# Patient Record
Sex: Male | Born: 1950
Health system: Southern US, Community
[De-identification: ages and names within clinical notes are randomized; demographics above are authoritative.]

## PROBLEM LIST (undated history)

## (undated) DIAGNOSIS — G629 Polyneuropathy, unspecified: Secondary | ICD-10-CM

## (undated) DIAGNOSIS — K219 Gastro-esophageal reflux disease without esophagitis: Secondary | ICD-10-CM

## (undated) DIAGNOSIS — Z87828 Personal history of other (healed) physical injury and trauma: Secondary | ICD-10-CM

## (undated) HISTORY — DX: Personal history of other (healed) physical injury and trauma: Z87.828

## (undated) HISTORY — PX: ESOPHAGOGASTRODUODENOSCOPY: SHX1529

## (undated) HISTORY — DX: Gilbert syndrome: E80.4

---

## 2002-01-10 DIAGNOSIS — Z87828 Personal history of other (healed) physical injury and trauma: Secondary | ICD-10-CM

## 2002-01-10 HISTORY — DX: Personal history of other (healed) physical injury and trauma: Z87.828

## 2008-06-12 ENCOUNTER — Ambulatory Visit: Payer: Self-pay | Admitting: Gastroenterology

## 2011-05-23 ENCOUNTER — Ambulatory Visit (INDEPENDENT_AMBULATORY_CARE_PROVIDER_SITE_OTHER): Payer: 59 | Admitting: Internal Medicine

## 2011-05-23 ENCOUNTER — Encounter: Payer: Self-pay | Admitting: Internal Medicine

## 2011-05-23 VITALS — BP 128/80 | HR 70 | Temp 98.4°F | Resp 18 | Ht 75.0 in | Wt 200.2 lb

## 2011-05-23 DIAGNOSIS — H539 Unspecified visual disturbance: Secondary | ICD-10-CM

## 2011-05-23 DIAGNOSIS — R5383 Other fatigue: Secondary | ICD-10-CM

## 2011-05-23 DIAGNOSIS — Z13220 Encounter for screening for lipoid disorders: Secondary | ICD-10-CM

## 2011-05-23 DIAGNOSIS — Z87828 Personal history of other (healed) physical injury and trauma: Secondary | ICD-10-CM

## 2011-05-23 DIAGNOSIS — Z125 Encounter for screening for malignant neoplasm of prostate: Secondary | ICD-10-CM

## 2011-05-23 DIAGNOSIS — H409 Unspecified glaucoma: Secondary | ICD-10-CM | POA: Insufficient documentation

## 2011-05-23 DIAGNOSIS — R5381 Other malaise: Secondary | ICD-10-CM

## 2011-05-23 NOTE — Progress Notes (Signed)
Patient ID: Craig Landry, male   DOB: 01-Feb-1950, 61 y.o.   MRN: [id number]  Patient Active Problem List  Diagnoses  . Glaucoma  . History of back injury  . Vision changes  . Screening for lipoid disorders    Subjective:  CC:   Chief Complaint  Patient presents with  . New Patient    HPI:   Craig Landry a 61 y.o. male who presents New patient, for evaluation of vision changes.  Craig Landry was recently diagnosed with glaucoma in both eyes by Lemar Lofty after presenting with 3 discreet episodes of visual changes described as "like looking through a screen door."  First episode lasted 1 hour and resolved spontaneously.  The two subsequent episodes were shorter in duration,  About 15 minutes each, and occurred about 2 weeks apart.  All occurred within the last month and a half. They were unaccompanied by headache or eye pain, and no parasthesias, dysphagia or slurred speech.  His father was apparently misdiagnosed for years and went blind before the diagnosis of temporal arteritis was made by an academic center, and he is worried about that Dr. Oren Bracket missed this diagnosis.  He has started the  Eye drops for glaucoma just a few days ago. ,.    Past Medical History  Diagnosis Date  . Glaucoma   . History of back injury 2004    work related injury ,  fell out of a truck    History reviewed. No pertinent past surgical history.       The following portions of the patient's history were reviewed and updated as appropriate: Allergies, current medications, and problem list.    Review of Systems:   12 Pt  review of systems was negative except those addressed in the HPI,     History   Social History  . Marital Status: Married    Spouse Name: N/A    Number of Children: N/A  . Years of Education: N/A   Occupational History  . SERVICE Chemical engineer,  uses software    Social History Main Topics  . Smoking status: Never Smoker   . Smokeless tobacco: Never  Used  . Alcohol Use: No  . Drug Use: No  . Sexually Active: Not on file   Other Topics Concern  . Not on file   Social History Narrative  . No narrative on file    Objective:  BP 128/80  Pulse 70  Temp(Src) 98.4 F (36.9 C) (Oral)  Resp 18  Ht 6\' 3"  (1.905 m)  Wt 200 lb 4 oz (90.833 kg)  BMI 25.03 kg/m2  SpO2 97%  General appearance: alert, cooperative and appears stated age Ears: normal TM's and external ear canals both ears Throat: lips, mucosa, and tongue normal; teeth and gums normal Neck: no adenopathy, no carotid bruit, supple, symmetrical, trachea midline and thyroid not enlarged, symmetric, no tenderness/mass/nodules Back: symmetric, no curvature. ROM normal. No CVA tenderness. Lungs: clear to auscultation bilaterally Heart: regular rate and rhythm, S1, S2 normal, no murmur, click, rub or gallop Abdomen: soft, non-tender; bowel sounds normal; no masses,  no organomegaly Pulses: 2+ and symmetric Skin: Skin color, texture, turgor normal. No rashes or lesions Lymph nodes: Cervical, supraclavicular, and axillary nodes normal.  Assessment and Plan:  Glaucoma Under treatment by Sydnor,.   Vision changes I see no indication based on patient's exam to suspect TA as the cause of his vision changes.  He has no temple tenderness, no history  of headaches or hypertension .  Reassurance provided.   History of back injury Remote,  Work injury,  With intermittent flares due to overuse.  Not currently symptomatic. Exam normal with regard to strength, sensation and DTRS./  Recommended prn NSAIDs pending evaluation of renal function.   Screening for lipoid disorders Return for fasting labs.     Updated Medication List No outpatient encounter prescriptions on file as of 05/23/2011.     Orders Placed This Encounter  Procedures  . TSH  . Lipid panel  . COMPLETE METABOLIC PANEL WITH GFR  . PSA    No Follow-up on file.

## 2011-05-24 ENCOUNTER — Encounter: Payer: Self-pay | Admitting: Internal Medicine

## 2011-05-24 DIAGNOSIS — H539 Unspecified visual disturbance: Secondary | ICD-10-CM | POA: Insufficient documentation

## 2011-05-24 DIAGNOSIS — Z13220 Encounter for screening for lipoid disorders: Secondary | ICD-10-CM | POA: Insufficient documentation

## 2011-05-24 NOTE — Assessment & Plan Note (Signed)
Return for fasting labs.

## 2011-05-24 NOTE — Assessment & Plan Note (Addendum)
I see no indication based on patient's exam to suspect TA as the cause of his vision changes.  He has no temple tenderness, no history of headaches or hypertension .  Reassurance provided.

## 2011-05-24 NOTE — Assessment & Plan Note (Signed)
Remote,  Work injury,  With intermittent flares due to overuse.  Not currently symptomatic. Exam normal with regard to strength, sensation and DTRS./  Recommended prn NSAIDs pending evaluation of renal function.

## 2011-05-24 NOTE — Assessment & Plan Note (Signed)
Under treatment by Oren Bracket,.

## 2011-07-05 ENCOUNTER — Other Ambulatory Visit: Payer: 59

## 2011-07-12 ENCOUNTER — Encounter (INDEPENDENT_AMBULATORY_CARE_PROVIDER_SITE_OTHER): Payer: 59 | Admitting: Internal Medicine

## 2012-01-20 ENCOUNTER — Encounter: Payer: Self-pay | Admitting: Internal Medicine

## 2012-01-20 ENCOUNTER — Ambulatory Visit (INDEPENDENT_AMBULATORY_CARE_PROVIDER_SITE_OTHER): Payer: 59 | Admitting: Internal Medicine

## 2012-01-20 VITALS — BP 126/86 | HR 76 | Temp 97.5°F | Resp 16 | Ht 74.0 in | Wt 199.5 lb

## 2012-01-20 DIAGNOSIS — Z125 Encounter for screening for malignant neoplasm of prostate: Secondary | ICD-10-CM

## 2012-01-20 DIAGNOSIS — R5381 Other malaise: Secondary | ICD-10-CM

## 2012-01-20 DIAGNOSIS — Z1211 Encounter for screening for malignant neoplasm of colon: Secondary | ICD-10-CM

## 2012-01-20 DIAGNOSIS — Z13220 Encounter for screening for lipoid disorders: Secondary | ICD-10-CM

## 2012-01-20 DIAGNOSIS — Z0000 Encounter for general adult medical examination without abnormal findings: Secondary | ICD-10-CM

## 2012-01-20 DIAGNOSIS — R5383 Other fatigue: Secondary | ICD-10-CM

## 2012-01-20 LAB — POCT URINALYSIS DIPSTICK
Ketones, UA: NEGATIVE
Leukocytes, UA: NEGATIVE
Nitrite, UA: NEGATIVE
Protein, UA: NEGATIVE

## 2012-01-20 LAB — TSH: TSH: 0.87 u[IU]/mL (ref 0.35–5.50)

## 2012-01-20 LAB — LIPID PANEL: HDL: 38.3 mg/dL — ABNORMAL LOW (ref >39.00)

## 2012-01-20 NOTE — Progress Notes (Signed)
Patient ID: Craig Landry, male   DOB: 07/16/1950, 62 y.o.   MRN: [id number]  Patient Active Problem List  Diagnosis  . Glaucoma  . History of back injury  . Vision changes  . Screening for lipoid disorders  . Routine general medical examination at a health care facility    Subjective:  CC:   Chief Complaint  Patient presents with  . Annual Exam    HPI:   Craig Landry a 62 y.o. 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 At last visit he had a recent opthalmologic precedure and was worried about his vision.  Vision is about the same as last time,  glaucoma managed by Syndnor, last eye pressure was 17 per patient. Has not had a screening coloosocopy ever,  But had an EGD  Years ago for reflux.    Past Medical History  Diagnosis Date  . History of back injury 2004    work related injury ,  fell out of a truck  . Glaucoma(365)     History reviewed. No pertinent past surgical history.       The following portions of the patient's history were reviewed and updated as appropriate: Allergies, current medications, and problem list.    Review of Systems:  Patient denies headache, fevers, malaise, unintentional weight loss, skin rash, eye pain, sinus congestion and sinus pain, sore throat, dysphagia,  hemoptysis , cough, dyspnea, wheezing, chest pain, palpitations, orthopnea, edema, abdominal pain, nausea, melena, diarrhea, constipation, flank pain, dysuria, hematuria, urinary  Frequency, nocturia, numbness, tingling, seizures,  Focal weakness, Loss of consciousness,  Tremor, insomnia, depression, anxiety, and suicidal ideation.       History   Social History  . Marital Status: Married    Spouse Name: N/A    Number of Children: N/A  . Years of Education: N/A   Occupational History  . SERVICE Chemical engineer,  uses software    Social History Main Topics  . Smoking status: Never Smoker   . Smokeless  tobacco: Never Used  . Alcohol Use: No  . Drug Use: No  . Sexually Active: Not on file   Other Topics Concern  . Not on file   Social History Narrative  . No narrative on file    Objective:  BP 126/86  Pulse 76  Temp 97.5 F (36.4 C) (Oral)  Resp 16  Ht 6\' 2"  (1.88 m)  Wt 199 lb 8 oz (90.493 kg)  BMI 25.61 kg/m2  SpO2 96%  General Appearance:    Alert, cooperative, no distress, appears stated age  Head:    Normocephalic, without obvious abnormality, atraumatic  Eyes:    PERRL, conjunctiva/corneas clear, EOM's intact, fundi    benign, both eyes       Ears:    Normal TM's and external ear canals, both ears  Nose:   Nares normal, septum midline, mucosa normal, no drainage   or sinus tenderness  Throat:   Lips, mucosa, and tongue normal; teeth and gums normal  Neck:   Supple, symmetrical, trachea midline, no adenopathy;       thyroid:  No enlargement/tenderness/nodules; no carotid   bruit or JVD  Back:     Symmetric, no curvature, ROM normal, no CVA tenderness  Lungs:     Clear to auscultation bilaterally, respirations unlabored  Chest wall:    No tenderness or deformity  Heart:    Regular rate and rhythm, S1  and S2 normal, no murmur, rub   or gallop  Abdomen:     Soft, non-tender, bowel sounds active all four quadrants,    no masses, no organomegaly  Genitalia:    Normal male without lesion, discharge or tenderness  Rectal:    Normal tone, normal prostate, no masses or tenderness;     Extremities:   Extremities normal, atraumatic, no cyanosis or edema  Pulses:   2+ and symmetric all extremities  Skin:   Skin color, texture, turgor normal, no rashes or lesions  Lymph nodes:   Cervical, supraclavicular, and axillary nodes normal  Neurologic:   CNII-XII intact. Normal strength, sensation and reflexes      throughout   Assessment and Plan:  Routine general medical examination at a health care facility His exam today was normal including genital and prostate exams.  Screening labs have been ordered PSA is normal.   Updated Medication List No outpatient encounter prescriptions on file as of 01/20/2012.     Orders Placed This Encounter  Procedures  . POCT Urinalysis Dipstick    No Follow-up on file.

## 2012-01-21 LAB — COMPLETE METABOLIC PANEL WITH GFR
Albumin: 4.3 g/dL (ref 3.5–5.2)
CO2: 31 mEq/L (ref 19–32)
Calcium: 9.6 mg/dL (ref 8.4–10.5)
Chloride: 102 mEq/L (ref 96–112)
GFR, Est African American: >89 mL/min
GFR, Est Non African American: 88 mL/min
Glucose, Bld: 115 mg/dL — ABNORMAL HIGH (ref 70–99)
Potassium: 4.4 mEq/L (ref 3.5–5.3)
Sodium: 141 mEq/L (ref 135–145)
Total Protein: 6.5 g/dL (ref 6.0–8.3)

## 2012-01-22 ENCOUNTER — Encounter: Payer: Self-pay | Admitting: Internal Medicine

## 2012-01-22 DIAGNOSIS — Z1211 Encounter for screening for malignant neoplasm of colon: Secondary | ICD-10-CM | POA: Insufficient documentation

## 2012-01-22 DIAGNOSIS — Z0000 Encounter for general adult medical examination without abnormal findings: Secondary | ICD-10-CM | POA: Insufficient documentation

## 2012-01-22 NOTE — Assessment & Plan Note (Signed)
His exam today was normal including genital and prostate exams. Screening labs have been ordered PSA is normal.

## 2012-01-24 DIAGNOSIS — Z0279 Encounter for issue of other medical certificate: Secondary | ICD-10-CM

## 2013-05-29 ENCOUNTER — Telehealth: Payer: Self-pay | Admitting: Internal Medicine

## 2013-05-29 NOTE — Telephone Encounter (Signed)
Has appt scheduled for tomorrow.

## 2013-05-29 NOTE — Telephone Encounter (Signed)
Patient Information:  Caller Name: Sylas  Phone: [phone]  Patient: Craig Landry, Craig Landry  Gender: Male  DOB: Mar 25, 1950  Age: 63 Years  PCP: Deborra Medina (Adults only)  Office Follow Up:  Does the office need to follow up with this patient?: No  Instructions For The Office: N/A  RN Note:  Has an appointment scheduled on 05/30/13 at 13:15. Advised patient to keep currently scheduled appointment, but to call office if any new symptoms or worsening symptoms develop. Patient verbalized understanding.  Symptoms  Reason For Call & Symptoms: Tick bite. Boil is now developing over where the tick bite was. Overnight the swelling and inflammation started. Patient reports he has been "digging at it." Reports the bite was on the waist area beneath his waistband.  Reviewed Health History In EMR: Yes  Reviewed Medications In EMR: Yes  Reviewed Allergies In EMR: Yes  Reviewed Surgeries / Procedures: Yes  Date of Onset of Symptoms: 05/28/2013  Treatments Tried: Removed tick and head was intact.  Treatments Tried Worked: No  Guideline(s) Used:  Tick Bite  Disposition Per Guideline:   See within 24 hours  Reason For Disposition Reached:   Patient wants to be seen  Advice Given:  N/A  Patient Will Follow Care Advice:  YES

## 2013-05-30 ENCOUNTER — Encounter: Payer: Self-pay | Admitting: Internal Medicine

## 2013-05-30 ENCOUNTER — Ambulatory Visit (INDEPENDENT_AMBULATORY_CARE_PROVIDER_SITE_OTHER): Payer: 59 | Admitting: Internal Medicine

## 2013-05-30 ENCOUNTER — Encounter (INDEPENDENT_AMBULATORY_CARE_PROVIDER_SITE_OTHER): Payer: Self-pay

## 2013-05-30 VITALS — BP 130/82 | HR 72 | Temp 98.2°F | Resp 16 | Ht 74.0 in | Wt 201.8 lb

## 2013-05-30 DIAGNOSIS — S80862A Insect bite (nonvenomous), left lower leg, initial encounter: Secondary | ICD-10-CM | POA: Insufficient documentation

## 2013-05-30 DIAGNOSIS — S30861A Insect bite (nonvenomous) of abdominal wall, initial encounter: Secondary | ICD-10-CM

## 2013-05-30 DIAGNOSIS — W57XXXA Bitten or stung by nonvenomous insect and other nonvenomous arthropods, initial encounter: Principal | ICD-10-CM | POA: Insufficient documentation

## 2013-05-30 DIAGNOSIS — S30860A Insect bite (nonvenomous) of lower back and pelvis, initial encounter: Secondary | ICD-10-CM

## 2013-05-30 MED ORDER — GENTAMICIN SULFATE 0.1 % EX OINT: 1.0000 "application " | TOPICAL_OINTMENT | Freq: Two times a day (BID) | CUTANEOUS | Status: DC

## 2013-05-30 MED ORDER — DOXYCYCLINE HYCLATE 100 MG PO TABS: 100.0000 mg | ORAL_TABLET | Freq: Two times a day (BID) | ORAL | Status: DC

## 2013-05-30 NOTE — Assessment & Plan Note (Signed)
Small local reaction at site of tick bite. Will start empiric topical gentamicin to help prevent secondary bacterial infection. Discussed signs/symptoms of RMSF. He was given Rx for Doxycycline to start if any fever, chills, headache, malaise. He will call or RTC if any of these symptoms develop.

## 2013-05-30 NOTE — Patient Instructions (Addendum)
Call immediately if any fever or headache this week.   Start topical gentamicin twice daily. Start Doxycycline twice daily. Avoid sun exposure while on Doxycycline.  Follow up recheck next week if area is not improved.   Consider seeing Dr. Malvin Johns regarding your hand.

## 2013-05-30 NOTE — Progress Notes (Signed)
   Subjective:    Patient ID: Craig Landry, male    DOB: 1950-05-14, 63 y.o.   MRN: [id number]  HPI 63YO male presents for acute visit. Noticed tick on his abdomen yesterday. Pulled tick off, however feels that head of tick may be in place. Notes some redness at site. Not painful, but occasionally itching. No fever, headache. No other symptoms.   Review of Systems  Constitutional: Negative for fever, chills and fatigue.  Respiratory: Negative for shortness of breath.   Cardiovascular: Negative for chest pain.  Musculoskeletal: Negative for arthralgias and myalgias.  Skin: Positive for color change, rash and wound.  Neurological: Positive for headaches. Negative for weakness.       Objective:    BP 130/82  Pulse 72  Temp(Src) 98.2 F (36.8 C) (Oral)  Resp 16  Ht 6\' 2"  (1.88 m)  Wt 201 lb 12 oz (91.513 kg)  BMI 25.89 kg/m2  SpO2 98% Physical Exam  Constitutional: He appears well-developed and well-nourished. No distress.  HENT:  Head: Normocephalic and atraumatic.  Eyes: Pupils are equal, round, and reactive to light.  Neck: Normal range of motion.  Pulmonary/Chest: Effort normal.  Skin: Skin is warm. He is not diaphoretic. There is erythema.             Assessment & Plan:   Problem List Items Addressed This Visit     Unprioritized   Tick bite of abdomen - Primary     Small local reaction at site of tick bite. Will start empiric topical gentamicin to help prevent secondary bacterial infection. Discussed signs/symptoms of RMSF. He was given Rx for Doxycycline to start if any fever, chills, headache, malaise. He will call or RTC if any of these symptoms develop.    Relevant Medications      DOXYCYCLINE HYCLATE 100 MG PO TABS      gentamicin (GARAMYCIN) ointment 0.1%       Return in about 1 week (around 06/06/2013) for Recheck.

## 2013-05-30 NOTE — Progress Notes (Signed)
Pre visit review using our clinic review tool, if applicable. No additional management support is needed unless otherwise documented below in the visit note. 

## 2013-06-07 ENCOUNTER — Ambulatory Visit: Payer: 59 | Admitting: Internal Medicine

## 2013-06-20 ENCOUNTER — Encounter: Payer: Self-pay | Admitting: Internal Medicine

## 2013-06-20 ENCOUNTER — Ambulatory Visit (INDEPENDENT_AMBULATORY_CARE_PROVIDER_SITE_OTHER): Payer: 59 | Admitting: Internal Medicine

## 2013-06-20 VITALS — BP 138/90 | HR 67 | Temp 97.9°F | Resp 16 | Ht 75.0 in | Wt 197.5 lb

## 2013-06-20 DIAGNOSIS — S30860A Insect bite (nonvenomous) of lower back and pelvis, initial encounter: Secondary | ICD-10-CM

## 2013-06-20 DIAGNOSIS — IMO0001 Reserved for inherently not codable concepts without codable children: Secondary | ICD-10-CM

## 2013-06-20 DIAGNOSIS — E785 Hyperlipidemia, unspecified: Secondary | ICD-10-CM

## 2013-06-20 DIAGNOSIS — S30861A Insect bite (nonvenomous) of abdominal wall, initial encounter: Secondary | ICD-10-CM

## 2013-06-20 DIAGNOSIS — R5381 Other malaise: Secondary | ICD-10-CM

## 2013-06-20 DIAGNOSIS — M549 Dorsalgia, unspecified: Secondary | ICD-10-CM

## 2013-06-20 DIAGNOSIS — Z125 Encounter for screening for malignant neoplasm of prostate: Secondary | ICD-10-CM

## 2013-06-20 DIAGNOSIS — R030 Elevated blood-pressure reading, without diagnosis of hypertension: Secondary | ICD-10-CM

## 2013-06-20 DIAGNOSIS — W57XXXA Bitten or stung by nonvenomous insect and other nonvenomous arthropods, initial encounter: Secondary | ICD-10-CM

## 2013-06-20 DIAGNOSIS — R5383 Other fatigue: Secondary | ICD-10-CM

## 2013-06-20 LAB — POCT URINALYSIS DIPSTICK
BILIRUBIN UA: NEGATIVE
Glucose, UA: NEGATIVE
KETONES UA: NEGATIVE
Leukocytes, UA: NEGATIVE
Nitrite, UA: NEGATIVE
Protein, UA: NEGATIVE
RBC UA: NEGATIVE
Spec Grav, UA: 1.015
Urobilinogen, UA: 0.2
pH, UA: 6.5

## 2013-06-20 NOTE — Patient Instructions (Signed)
Your tick bite is resolving ,  But you should finish the doxycycline twice daily  With big meals to prevent stomach upset.  This protects you agains t [facility] Spotted Fever and other tick borne illnesses  Your urine was negative for signs of infection  Your Back pain is due to strain ,  Try using ibuprofen  Up to 800 mg every 8 hours, along with tylenol (acetominophen) 1000 mg every 8 hours  You can return for tick bite serologies (blood tests ) mid July  Along with fasting labs .  Please schedule your annual physical in late July or early [place]

## 2013-06-20 NOTE — Progress Notes (Signed)
Patient ID: Craig Landry, male   DOB: 09-12-1950, 63 y.o.   MRN: [id number]   Patient Active Problem List   Diagnosis Date Noted  . Tick bite of abdomen 05/30/2013  . Routine general medical examination at a health care facility 01/22/2012  . Screening for colon cancer 01/22/2012  . Vision changes 05/24/2011  . Screening for lipoid disorders 05/24/2011  . Glaucoma   . History of back injury     Subjective:  CC:   Chief Complaint  Patient presents with  . Tick Removal    follow up,  saw Dr. walker was prescribed doxy took app. half an stopped.  . Back Pain    bilateral at the end of costal cartilage 11 and 12 rib.    HPI:   Craig Landry is a 63 y.o. male who presents for follow up on tick bite. Removed engorged tick from his lower abdomen the week before [facility],  Was treated with topical gentamycin  by JW for skin irritation /localized reaction thought to be related to the bite.  Developed symptoms   Of redness resolved..  Last week started feeling poorly,  No fevers,  (he checked) area became irritated again so he started takign the doxycycline   Past Medical History  Diagnosis Date  . History of back injury 2004    work related injury ,  fell out of a truck  . Glaucoma     No past surgical history on file.     The following portions of the patient's history were reviewed and updated as appropriate: Allergies, current medications, and problem list.    Review of Systems:   Patient denies headache, fevers, malaise, unintentional weight loss, skin rash, eye pain, sinus congestion and sinus pain, sore throat, dysphagia,  hemoptysis , cough, dyspnea, wheezing, chest pain, palpitations, orthopnea, edema, abdominal pain, nausea, melena, diarrhea, constipation, flank pain, dysuria, hematuria, urinary  Frequency, nocturia, numbness, tingling, seizures,  Focal weakness, Loss of consciousness,  Tremor, insomnia, depression, anxiety, and suicidal ideation.      History   Social History  . Marital Status: Married    Spouse Name: N/A    Number of Children: N/A  . Years of Education: N/A   Occupational History  . SERVICE Tour manager,  uses software    Social History Main Topics  . Smoking status: Never Smoker   . Smokeless tobacco: Never Used  . Alcohol Use: No  . Drug Use: No  . Sexual Activity: Not on file   Other Topics Concern  . Not on file   Social History Narrative  . No narrative on file    Objective:  Filed Vitals:   06/20/13 1020  BP: 138/90  Pulse: 67  Temp: 97.9 F (36.6 C)  Resp: 16     General appearance: alert, cooperative and appears stated age Ears: normal TM's and external ear canals both ears Throat: lips, mucosa, and tongue normal; teeth and gums normal Neck: no adenopathy, no carotid bruit, supple, symmetrical, trachea midline and thyroid not enlarged, symmetric, no tenderness/mass/nodules Back: symmetric, no curvature. ROM normal. No CVA tenderness. Lungs: clear to auscultation bilaterally Heart: regular rate and rhythm, S1, S2 normal, no murmur, click, rub or gallop Abdomen: soft, non-tender; bowel sounds normal; no masses,  no organomegaly Pulses: 2+ and symmetric Skin: Skin color, texture, turgor normal. Lower abdomen with localized erythema. No rashes or lesions Lymph nodes: Cervical, supraclavicular, and axillary nodes normal.  Assessment and Plan:  Tick  bite of abdomen Localized erythema appears to be irritation either from ue of topical abx or sequelae of healing process.  Stop gentamycin   Use  Doxycycline  to complete abx regimen.    Updated Medication List Outpatient Encounter Prescriptions as of 06/20/2013  Medication Sig  . cholecalciferol (VITAMIN D) 1000 UNITS tablet Take 1,000 Units by mouth daily.  Marland Kitchen doxycycline (VIBRA-TABS) 100 MG tablet Take 1 tablet (100 mg total) by mouth 2 (two) times daily.  Marland Kitchen gentamicin ointment (GARAMYCIN) 0.1 % Apply 1 application topically  2 (two) times daily.     Orders Placed This Encounter  Procedures  . CBC with Differential  . Comprehensive metabolic panel  . TSH  . Lipid panel  . PSA  . Microalbumin / creatinine urine ratio  . Rocky [place]tn spotted fvr abs pnl(IgG+IgM)  . Ehrlichia antibody panel  . POCT Urinalysis Dipstick    Return in about 3 months (around 09/20/2013).

## 2013-06-23 NOTE — Assessment & Plan Note (Signed)
Localized erythema appears to be irritation either from ue of topical abx or sequelae of healing process.  Stop gentamycin   Use  Doxycycline  to complete abx regimen.

## 2013-07-26 ENCOUNTER — Other Ambulatory Visit (INDEPENDENT_AMBULATORY_CARE_PROVIDER_SITE_OTHER): Payer: 59

## 2013-07-26 DIAGNOSIS — E785 Hyperlipidemia, unspecified: Secondary | ICD-10-CM

## 2013-07-26 DIAGNOSIS — R5383 Other fatigue: Principal | ICD-10-CM

## 2013-07-26 DIAGNOSIS — IMO0001 Reserved for inherently not codable concepts without codable children: Secondary | ICD-10-CM

## 2013-07-26 DIAGNOSIS — R030 Elevated blood-pressure reading, without diagnosis of hypertension: Secondary | ICD-10-CM

## 2013-07-26 DIAGNOSIS — S30861A Insect bite (nonvenomous) of abdominal wall, initial encounter: Secondary | ICD-10-CM

## 2013-07-26 DIAGNOSIS — Z125 Encounter for screening for malignant neoplasm of prostate: Secondary | ICD-10-CM

## 2013-07-26 DIAGNOSIS — R5381 Other malaise: Secondary | ICD-10-CM

## 2013-07-26 DIAGNOSIS — W57XXXA Bitten or stung by nonvenomous insect and other nonvenomous arthropods, initial encounter: Secondary | ICD-10-CM

## 2013-07-26 LAB — LIPID PANEL
CHOLESTEROL: 154 mg/dL (ref 0–200)
HDL: 36.4 mg/dL — ABNORMAL LOW (ref >39.00)
LDL Cholesterol: 87 mg/dL (ref 0–99)
NONHDL: 117.6
Total CHOL/HDL Ratio: 4
Triglycerides: 154 mg/dL — ABNORMAL HIGH (ref 0.0–149.0)
VLDL: 30.8 mg/dL (ref 0.0–40.0)

## 2013-07-26 LAB — CBC WITH DIFFERENTIAL/PLATELET
BASOS PCT: 0.5 % (ref 0.0–3.0)
Basophils Absolute: 0 10*3/uL (ref 0.0–0.1)
Eosinophils Absolute: 0.2 10*3/uL (ref 0.0–0.7)
Eosinophils Relative: 4.6 % (ref 0.0–5.0)
HEMATOCRIT: 42.5 % (ref 39.0–52.0)
Hemoglobin: 14.4 g/dL (ref 13.0–17.0)
LYMPHS ABS: 1.5 10*3/uL (ref 0.7–4.0)
Lymphocytes Relative: 29.2 % (ref 12.0–46.0)
MCHC: 33.8 g/dL (ref 30.0–36.0)
MCV: 93.9 fl (ref 78.0–100.0)
MONO ABS: 0.5 10*3/uL (ref 0.1–1.0)
MONOS PCT: 9.9 % (ref 3.0–12.0)
Neutro Abs: 2.8 10*3/uL (ref 1.4–7.7)
Neutrophils Relative %: 55.8 % (ref 43.0–77.0)
Platelets: 191 10*3/uL (ref 150.0–400.0)
RBC: 4.52 Mil/uL (ref 4.22–5.81)
RDW: 13.2 % (ref 11.5–15.5)
WBC: 5.1 10*3/uL (ref 4.0–10.5)

## 2013-07-26 LAB — COMPREHENSIVE METABOLIC PANEL
ALK PHOS: 91 U/L (ref 39–117)
ALT: 28 U/L (ref 0–53)
AST: 25 U/L (ref 0–37)
Albumin: 3.8 g/dL (ref 3.5–5.2)
BUN: 14 mg/dL (ref 6–23)
CO2: 26 meq/L (ref 19–32)
Calcium: 9.3 mg/dL (ref 8.4–10.5)
Chloride: 105 mEq/L (ref 96–112)
Creatinine, Ser: 0.9 mg/dL (ref 0.4–1.5)
GFR: 87.53 mL/min (ref >60.00)
Glucose, Bld: 99 mg/dL (ref 70–99)
Potassium: 4.4 mEq/L (ref 3.5–5.1)
Sodium: 140 mEq/L (ref 135–145)
Total Bilirubin: 0.9 mg/dL (ref 0.2–1.2)
Total Protein: 6.4 g/dL (ref 6.0–8.3)

## 2013-07-26 LAB — TSH: TSH: 1.2 u[IU]/mL (ref 0.35–4.50)

## 2013-07-26 LAB — PSA: PSA: 1.04 ng/mL (ref 0.10–4.00)

## 2013-07-26 LAB — MICROALBUMIN / CREATININE URINE RATIO
CREATININE, U: 203 mg/dL
MICROALB UR: 0.3 mg/dL (ref 0.0–1.9)
Microalb Creat Ratio: 0.1 mg/g (ref 0.0–30.0)

## 2013-07-29 LAB — ROCKY MTN SPOTTED FVR ABS PNL(IGG+IGM)
RMSF IgG: 0.56 IV
RMSF IgM: 0.79 IV

## 2013-07-30 ENCOUNTER — Telehealth: Payer: Self-pay | Admitting: Internal Medicine

## 2013-07-30 LAB — EHRLICHIA ANTIBODY PANEL: E chaffeensis (HGE) Ab, IgG: <1:64 {titer}

## 2013-07-30 NOTE — Telephone Encounter (Signed)
Results are not completed yet,  But thus far all normal.  No rocky [place] spotted fever by antibody testing

## 2013-07-30 NOTE — Telephone Encounter (Signed)
Patient notified and voiced understanding.

## 2013-07-30 NOTE — Telephone Encounter (Signed)
Left message for patient to call office.  

## 2013-07-30 NOTE — Telephone Encounter (Signed)
Patient calleed for results on labs performed on 07/26/13 please advise

## 2013-08-05 ENCOUNTER — Encounter: Payer: 59 | Admitting: Internal Medicine

## 2013-09-17 ENCOUNTER — Encounter: Payer: 59 | Admitting: Internal Medicine

## 2013-09-20 ENCOUNTER — Ambulatory Visit: Payer: 59 | Admitting: Internal Medicine

## 2013-12-25 ENCOUNTER — Ambulatory Visit (INDEPENDENT_AMBULATORY_CARE_PROVIDER_SITE_OTHER): Payer: BLUE CROSS/BLUE SHIELD | Admitting: Nurse Practitioner

## 2013-12-25 ENCOUNTER — Encounter: Payer: Self-pay | Admitting: Nurse Practitioner

## 2013-12-25 ENCOUNTER — Telehealth: Payer: Self-pay

## 2013-12-25 VITALS — BP 158/84 | HR 87 | Resp 14 | Ht 75.0 in | Wt 201.0 lb

## 2013-12-25 DIAGNOSIS — IMO0001 Reserved for inherently not codable concepts without codable children: Secondary | ICD-10-CM

## 2013-12-25 DIAGNOSIS — R030 Elevated blood-pressure reading, without diagnosis of hypertension: Secondary | ICD-10-CM

## 2013-12-25 MED ORDER — HYDROCHLOROTHIAZIDE 12.5 MG PO TABS: 12.5000 mg | ORAL_TABLET | Freq: Every day | ORAL | Status: DC

## 2013-12-25 NOTE — Patient Instructions (Signed)
Hydrochlorothiazide, HCTZ capsules or tablets What is this medicine? HYDROCHLOROTHIAZIDE (hye droe klor oh THYE a zide) is a diuretic. It increases the amount of urine passed, which causes the body to lose salt and water. This medicine is used to treat high blood pressure. It is also reduces the swelling and water retention caused by various medical conditions, such as heart, liver, or kidney disease. This medicine may be used for other purposes; ask your health care provider or pharmacist if you have questions. COMMON BRAND NAME(S): Esidrix, Ezide, HydroDIURIL, Microzide, Oretic, Zide What should I tell my health care provider before I take this medicine? They need to know if you have any of these conditions: -diabetes -gout -immune system problems, like lupus -kidney disease or kidney stones -liver disease -pancreatitis -small amount of urine or difficulty passing urine -an unusual or allergic reaction to hydrochlorothiazide, sulfa drugs, other medicines, foods, dyes, or preservatives -pregnant or trying to get pregnant -breast-feeding How should I use this medicine? Take this medicine by mouth with a glass of water. Follow the directions on the prescription label. Take your medicine at regular intervals. Remember that you will need to pass urine frequently after taking this medicine. Do not take your doses at a time of day that will cause you problems. Do not stop taking your medicine unless your doctor tells you to. Talk to your pediatrician regarding the use of this medicine in children. Special care may be needed. Overdosage: If you think you have taken too much of this medicine contact a poison control center or emergency room at once. NOTE: This medicine is only for you. Do not share this medicine with others. What if I miss a dose? If you miss a dose, take it as soon as you can. If it is almost time for your next dose, take only that dose. Do not take double or extra doses. What may  interact with this medicine? -cholestyramine -colestipol -digoxin -dofetilide -lithium -medicines for blood pressure -medicines for diabetes -medicines that relax muscles for surgery -other diuretics -steroid medicines like prednisone or cortisone This list may not describe all possible interactions. Give your health care provider a list of all the medicines, herbs, non-prescription drugs, or dietary supplements you use. Also tell them if you smoke, drink alcohol, or use illegal drugs. Some items may interact with your medicine. What should I watch for while using this medicine? Visit your doctor or health care professional for regular checks on your progress. Check your blood pressure as directed. Ask your doctor or health care professional what your blood pressure should be and when you should contact him or her. You may need to be on a special diet while taking this medicine. Ask your doctor. Check with your doctor or health care professional if you get an attack of severe diarrhea, nausea and vomiting, or if you sweat a lot. The loss of too much body fluid can make it dangerous for you to take this medicine. You may get drowsy or dizzy. Do not drive, use machinery, or do anything that needs mental alertness until you know how this medicine affects you. Do not stand or sit up quickly, especially if you are an older patient. This reduces the risk of dizzy or fainting spells. Alcohol may interfere with the effect of this medicine. Avoid alcoholic drinks. This medicine may affect your blood sugar level. If you have diabetes, check with your doctor or health care professional before changing the dose of your diabetic medicine. This medicine  can make you more sensitive to the sun. Keep out of the sun. If you cannot avoid being in the sun, wear protective clothing and use sunscreen. Do not use sun lamps or tanning beds/booths. What side effects may I notice from receiving this medicine? Side effects  that you should report to your doctor or health care professional as soon as possible: -allergic reactions such as skin rash or itching, hives, swelling of the lips, mouth, tongue, or throat -changes in vision -chest pain -eye pain -fast or irregular heartbeat -feeling faint or lightheaded, falls -gout attack -muscle pain or cramps -pain or difficulty when passing urine -pain, tingling, numbness in the hands or feet -redness, blistering, peeling or loosening of the skin, including inside the mouth -unusually weak or tired Side effects that usually do not require medical attention (report to your doctor or health care professional if they continue or are bothersome): -change in sex drive or performance -dry mouth -headache -stomach upset This list may not describe all possible side effects. Call your doctor for medical advice about side effects. You may report side effects to FDA at 1-800-FDA-1088. Where should I keep my medicine? Keep out of the reach of children. Store at room temperature between 15 and 30 degrees C (59 and 86 degrees F). Do not freeze. Protect from light and moisture. Keep container closed tightly. Throw away any unused medicine after the expiration date. NOTE: This sheet is a summary. It may not cover all possible information. If you have questions about this medicine, talk to your doctor, pharmacist, or health care provider.  2015, [employer]. (2009-08-21 12:57:37)

## 2013-12-25 NOTE — Assessment & Plan Note (Addendum)
Pt had elevated BP at DOT Physical and was told to be seen at primary care office. 186/120 at DOT physical, 132/90 at home, and 158/84 in office. Rx for HCTZ 12.5 tablets 1 PO daily. Pt will return in 4 weeks for FU. Discussed recording BP and we will average at next visit. Can go to DOT physical to re-do in the mean time when BP is stable below 140/90.

## 2013-12-25 NOTE — Telephone Encounter (Signed)
Discard note - pt added to C.Doss's schedule (she had a cancellation)  for today (12/25/13)

## 2013-12-25 NOTE — Progress Notes (Signed)
Pre visit review using our clinic review tool, if applicable. No additional management support is needed unless otherwise documented below in the visit note. 
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
## 2013-12-25 NOTE — Telephone Encounter (Signed)
The patient called and is hoping to be worked in for blood pressure issues.  He stated he went to have his DOT physical, but was denied due to high blood pressure.  Do you want him worked in? If so, where?

## 2013-12-25 NOTE — Telephone Encounter (Signed)
Dr. Lupita Dawn pt

## 2014-01-09 ENCOUNTER — Ambulatory Visit: Payer: Self-pay | Admitting: Internal Medicine

## 2014-01-09 LAB — COMPREHENSIVE METABOLIC PANEL
ALK PHOS: 105 U/L
ANION GAP: 6 — AB (ref 7–16)
Albumin: 4 g/dL (ref 3.4–5.0)
BILIRUBIN TOTAL: 3.1 mg/dL — AB (ref 0.2–1.0)
BUN: 11 mg/dL (ref 7–18)
CHLORIDE: 106 mmol/L (ref 98–107)
CREATININE: 0.93 mg/dL (ref 0.60–1.30)
Calcium, Total: 9.3 mg/dL (ref 8.5–10.1)
Co2: 29 mmol/L (ref 21–32)
EGFR (Non-African Amer.): >60
Glucose: 100 mg/dL — ABNORMAL HIGH (ref 65–99)
OSMOLALITY: 281 (ref 275–301)
Potassium: 4 mmol/L (ref 3.5–5.1)
SGOT(AST): 42 U/L — ABNORMAL HIGH (ref 15–37)
SGPT (ALT): 49 U/L
SODIUM: 141 mmol/L (ref 136–145)
Total Protein: 7.5 g/dL (ref 6.4–8.2)

## 2014-01-09 LAB — PROTIME-INR
INR: 1
Prothrombin Time: 12.8 secs (ref 11.5–14.7)

## 2014-01-09 LAB — CBC
HCT: 48.4 % (ref 40.0–52.0)
HGB: 16.5 g/dL (ref 13.0–18.0)
MCH: 31.8 pg (ref 26.0–34.0)
MCHC: 34.1 g/dL (ref 32.0–36.0)
MCV: 93 fL (ref 80–100)
PLATELETS: 190 10*3/uL (ref 150–440)
RBC: 5.2 10*6/uL (ref 4.40–5.90)
RDW: 13 % (ref 11.5–14.5)
WBC: 8.4 10*3/uL (ref 3.8–10.6)

## 2014-01-09 LAB — BILIRUBIN, DIRECT: BILIRUBIN DIRECT: 0.3 mg/dL — AB (ref 0.0–0.2)

## 2014-01-29 ENCOUNTER — Encounter: Payer: Self-pay | Admitting: Nurse Practitioner

## 2014-01-29 ENCOUNTER — Ambulatory Visit (INDEPENDENT_AMBULATORY_CARE_PROVIDER_SITE_OTHER): Payer: BLUE CROSS/BLUE SHIELD | Admitting: Nurse Practitioner

## 2014-01-29 VITALS — BP 130/80 | HR 78 | Temp 97.6°F | Resp 14 | Ht 75.0 in | Wt 202.4 lb

## 2014-01-29 DIAGNOSIS — R030 Elevated blood-pressure reading, without diagnosis of hypertension: Secondary | ICD-10-CM

## 2014-01-29 DIAGNOSIS — IMO0001 Reserved for inherently not codable concepts without codable children: Secondary | ICD-10-CM

## 2014-01-29 NOTE — Progress Notes (Signed)
   Subjective:    Patient ID: Craig Landry, male    DOB: 1950/01/25, 64 y.o.   MRN: [id number]  HPI  Mr. Tomberlin is a 64 yo male here for a follow up of hypertension.   1) Quit taking for a few days, started back a few days ago 120'/80's at home. Keeping log at home. Passed DOT physical on the re-do after being placed on HCTZ 12.5 mg daily. He states he does urinate more, but it is not bothersome.   Review of Systems  Constitutional: Negative for fever, chills, diaphoresis, fatigue and unexpected weight change.  Eyes: Negative for visual disturbance.  Respiratory: Negative for chest tightness, shortness of breath and wheezing.   Cardiovascular: Negative for chest pain, palpitations and leg swelling.  Gastrointestinal: Negative for nausea, vomiting and diarrhea.  Musculoskeletal: Negative for myalgias.  Skin: Negative for rash.  Neurological: Negative for dizziness, weakness, numbness and headaches.      Objective:   Physical Exam  Constitutional: He is oriented to person, place, and time. He appears well-developed and well-nourished. No distress.  BP 130/80 mmHg  Pulse 78  Temp(Src) 97.6 F (36.4 C) (Oral)  Resp 14  Ht 6\' 3"  (1.905 m)  Wt 202 lb 6.4 oz (91.808 kg)  BMI 25.30 kg/m2  SpO2 97%   HENT:  Head: Normocephalic and atraumatic.  Cardiovascular: Normal rate and regular rhythm.   Pulmonary/Chest: Effort normal and breath sounds normal.  Neurological: He is alert and oriented to person, place, and time.  Skin: Skin is warm and dry. No rash noted. He is not diaphoretic.  Psychiatric: He has a normal mood and affect. His behavior is normal. Judgment and thought content normal.      Assessment & Plan:

## 2014-01-29 NOTE — Patient Instructions (Signed)
Make an appointment for after 07/27/2014 for lab work and a visit.   When your refills on your HCTZ runs out just let the pharmacy know and they will contact our office.

## 2014-01-29 NOTE — Progress Notes (Signed)
Pre visit review using our clinic review tool, if applicable. No additional management support is needed unless otherwise documented below in the visit note. 

## 2014-02-01 NOTE — Assessment & Plan Note (Signed)
Stable. Pt passed his physical for a job the second time. Continue HCTZ 12.5 mg daily. FU in July for physical with lab work.

## 2014-02-11 ENCOUNTER — Encounter: Payer: Self-pay | Admitting: Internal Medicine

## 2014-03-25 ENCOUNTER — Emergency Department: Payer: Self-pay | Admitting: Emergency Medicine

## 2014-05-03 NOTE — Consult Note (Signed)
Brief Consult Note: Diagnosis: esophageal meat impaction.   Patient was seen by consultant.   Consult note dictated.   Recommend to proceed with surgery or procedure.   Discussed with Attending MD.   Comments: Patient seen examined, chart reviewed. Patient presenting with dysphagia, esophageal food impaction, initially occured last night.  Will proceed with egd, foriegn body removal.  I have discussed the risks benefits and complications of egd to include not limited to bleeding infection perforationa dn sedationa dn he wishes to proceed. Please see full GI consult [phone].Marland Kitchen  Electronic Signatures: Loistine Simas (MD)  (Signed 31-Dec-15 13:55)  Authored: Brief Consult Note   Last Updated: 31-Dec-15 13:55 by Loistine Simas (MD)

## 2014-05-07 NOTE — Consult Note (Signed)
PATIENT NAME:  Craig, Landry MR#:  [id number] DATE OF BIRTH:  1950/07/19  DATE OF CONSULTATION:  01/09/2014  REFERRING PHYSICIAN:  Ferman Hamming, MD CONSULTING PHYSICIAN:  Lollie Sails, MD  REASON FOR CONSULTATION: Esophageal foreign body.   HISTORY OF PRESENT ILLNESS: Mr. Craig Landry is a 64 year old Caucasian male with a history of intermittent dysphagia for a number of years. He states that he was eating some spareribs last night at about 6:00. One of these got stuck. It apparently was not the bone but just the meat. He has been unable to handle secretions or drink water or pass other material since that time. He does have a history of dysphagia that goes back several years. He did have an EGD in 2011. He has apparently never been dilated. He does have to regurgitate foods perhaps every couple of months. Currently, he is having some low retrosternal discomfort that has occurred since getting this food material stuck. Otherwise, he denies any instances of nausea, vomiting, or abdominal pain. He does take Zegerid on a near daily basis and has heartburn and symptomatic reflux if he does not. He has a regular bowel movement. He denies any black stools, blood in the stools, or slimy stools.   GASTROINTESTINAL FAMILY HISTORY: Pertinent for father with problems with dysphagia as well. There is also a family history of Gilbert syndrome. Negative for colorectal cancer, liver disease or ulcers otherwise. The patient has never had an ulcer.   PAST MEDICAL HISTORY: Includes hypertension and glaucoma. He denies any history of surgeries. He did have a hospitalization, remote, for carbon monoxide poisoning.   ALLERGIES: He has no known drug allergies.   CURRENT MEDICATIONS: Include hydrochlorothiazide 12.5 mg. He also uses Latanoprost eye drops and takes occasional Advil. At times he takes the Advil immediately before going to bed or awakening at night and taking it at that time and going right back to  bed. We discussed possible complications from that issue.   PHYSICAL EXAMINATION: VITAL SIGNS: Temperature 97.8, pulse 70, respirations 20, blood pressure 134/90, pulse ox 100%.  GENERAL: He is a 64 year old Caucasian male in no acute distress.  HEENT: Normocephalic, atraumatic. Eyes: Anicteric. Nose: Septum midline. No lesions. Oropharynx: No lesions.  NECK: Supple. No JVD. No lymphadenopathy. No thyromegaly.  HEART: Regular rate and rhythm.  LUNGS: Clear.  ABDOMEN: Soft. He is mildly tender to palpation in the upper epigastric region. Bowel sounds positive, normoactive. There is no apparent organomegaly or masses felt.  RECTAL: Anorectal exam deferred.  EXTREMITIES: No clubbing, cyanosis, or edema.  NEUROLOGIC: Cranial nerves II through XII grossly intact. Muscle strength bilaterally equal and symmetric.   DIAGNOSTIC DATA: Laboratories include the following: He has a glucose of 100, BUN 11, creatinine 0.93, sodium 141, potassium 4, chloride 106, bicarb 29, calcium 9.3. Hepatic profile shows a total protein of 7.5, albumin 4, total bilirubin 3.1, alkaline phosphatase 105, AST 42, ALT 49. Hemogram showing white count of 8.4, H and H 16.5 and 48.4, platelet count 190,000. Currently awaiting labs pending to include a direct bilirubin and a pro time.   He had a soft tissue film of the neck which was unremarkable.   He had a chest film showing no abnormality.   ASSESSMENT:  1.  Esophageal food impaction.  2.  History of dysphagia.  3.  History of glaucoma.  4.  History of hypertension.  5.  Abnormal liver associated enzymes, possibly Gilbert syndrome.   RECOMMENDATION: We will proceed with EGD. I have discussed the  risks, benefits, and complications of EGD to include but not limited to bleeding, infection, perforation, and the risk of sedation, and he wishes to proceed.   ADDENDUM: He did have an EGD done on 06/12/2008. This was a normal study.  ____________________________ Lollie Sails, MD mus:sb D: 01/09/2014 13:53:49 ET T: 01/09/2014 14:10:01 ET JOB#: [id number]  cc: Lollie Sails, MD, <Dictator> Lollie Sails MD ELECTRONICALLY SIGNED 01/21/2014 12:06

## 2015-07-01 DIAGNOSIS — H401131 Primary open-angle glaucoma, bilateral, mild stage: Secondary | ICD-10-CM | POA: Diagnosis not present

## 2015-07-09 DIAGNOSIS — D239 Other benign neoplasm of skin, unspecified: Secondary | ICD-10-CM | POA: Diagnosis not present

## 2015-07-09 DIAGNOSIS — L821 Other seborrheic keratosis: Secondary | ICD-10-CM | POA: Diagnosis not present

## 2015-12-28 DIAGNOSIS — H401131 Primary open-angle glaucoma, bilateral, mild stage: Secondary | ICD-10-CM | POA: Diagnosis not present

## 2016-01-13 DIAGNOSIS — H401131 Primary open-angle glaucoma, bilateral, mild stage: Secondary | ICD-10-CM | POA: Diagnosis not present

## 2016-05-03 IMAGING — CT CT ANGIO CHEST
2 of 6 series · 18 of 36 positions shown · IV contrast (APPLIED)
Comparison: 03/25/2014 chest x-ray.

CLINICAL DATA: Right lower quadrant pain for 2 days.  Nausea.

EXAM:
CT ANGIOGRAPHY CHEST WITH CONTRAST
TECHNIQUE: Multidetector CT imaging of the chest was performed using the
standard protocol during bolus administration of intravenous
contrast. Multiplanar CT image reconstructions and MIPs were
obtained to evaluate the vascular anatomy.
CONTRAST:  100 cc Omnipaque 350 IV.

[Series 5: pe 1.0 thins · axial · 0.74mm/px · z∈[-466,-180]mm · 17 of 321 slices shown]
[im 18/321  lung]
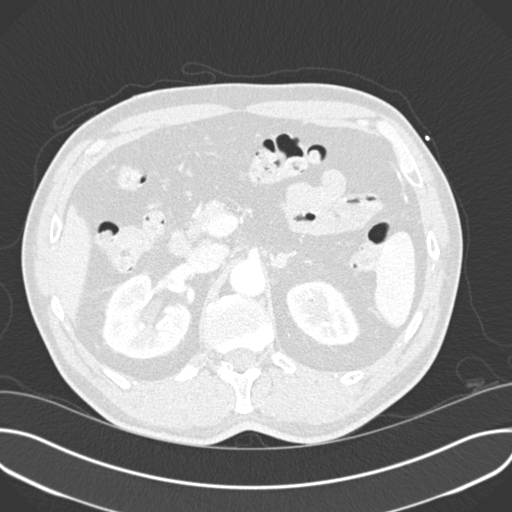
[im 36/321  mediastinal]
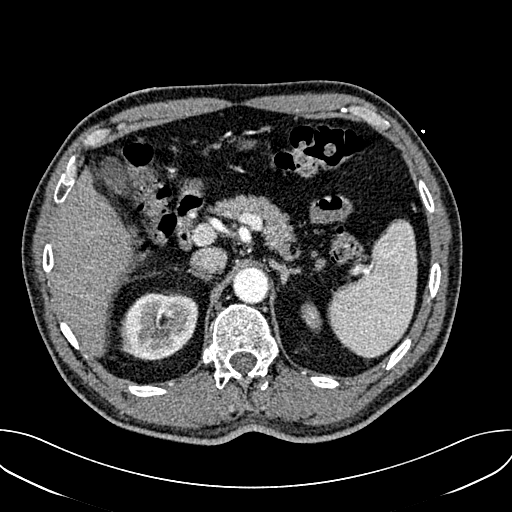
[im 54/321  lung]
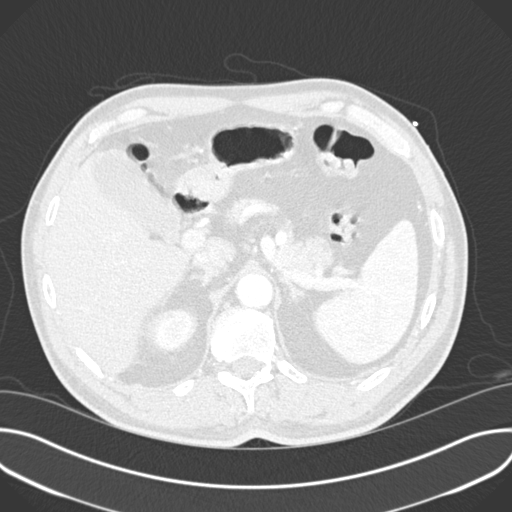
[im 72/321  mediastinal]
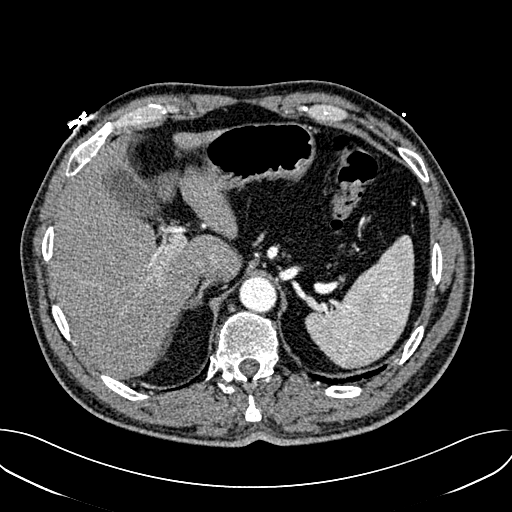
[im 89/321  lung]
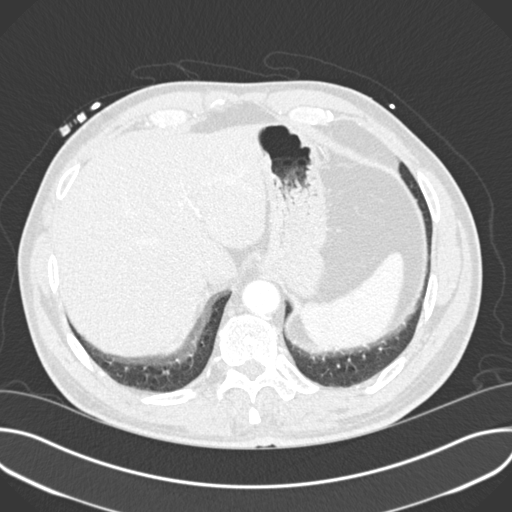
[im 107/321  mediastinal]
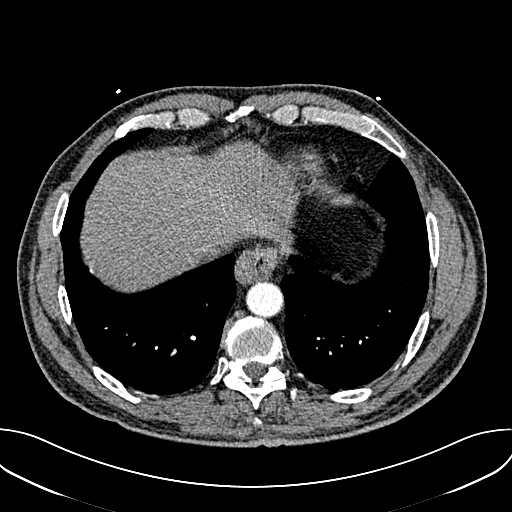
[im 125/321  lung]
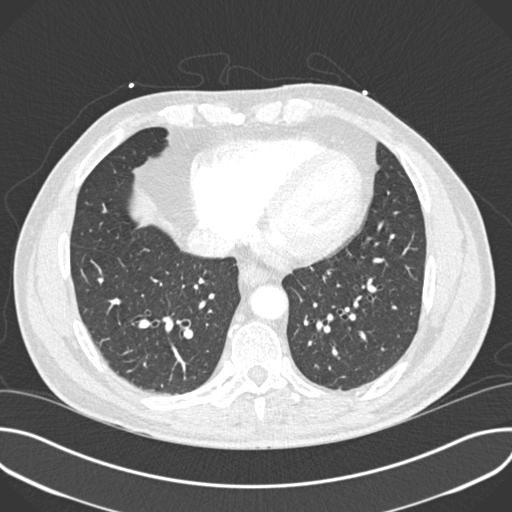
[im 143/321  mediastinal]
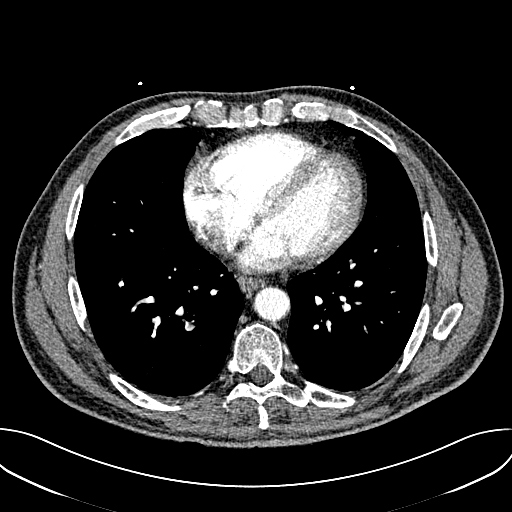
[im 161/321  lung]
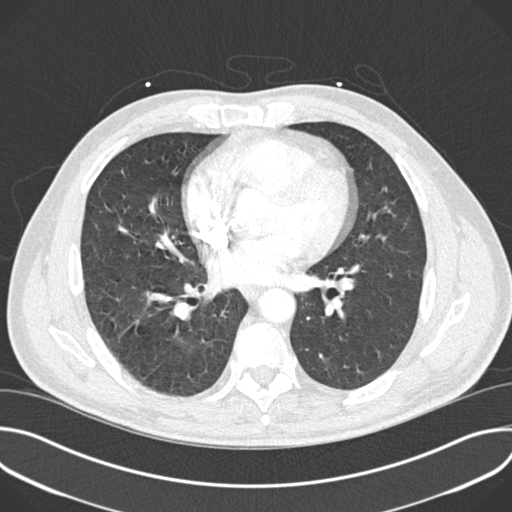
[im 178/321  mediastinal]
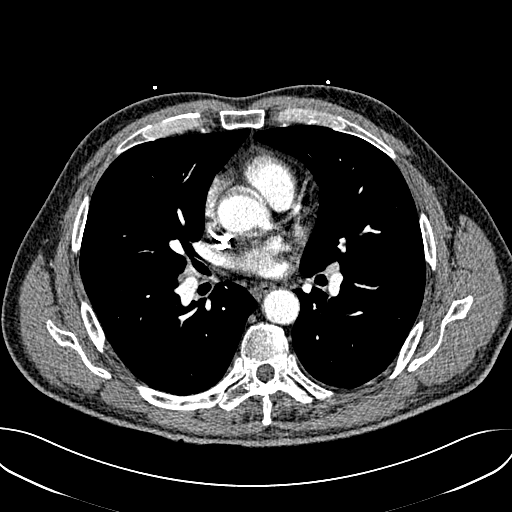
[im 196/321  lung]
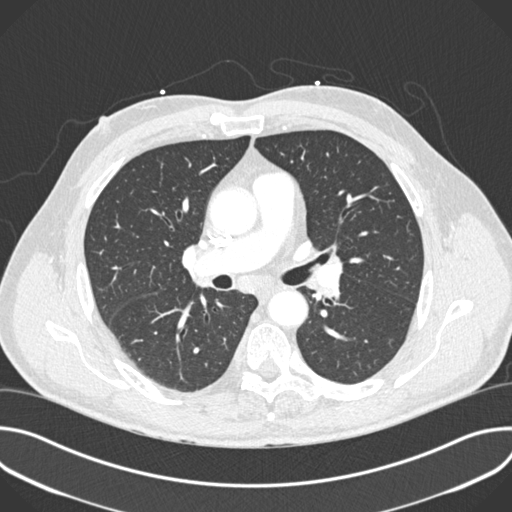
[im 214/321  mediastinal]
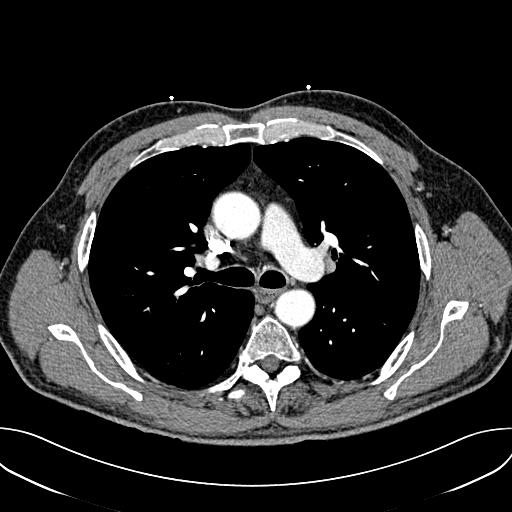
[im 232/321  lung]
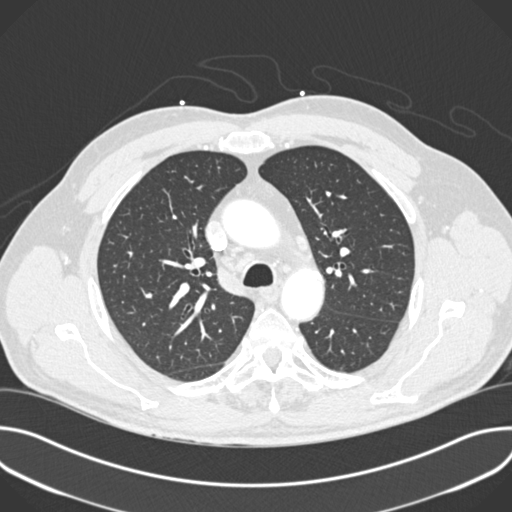
[im 249/321  mediastinal]
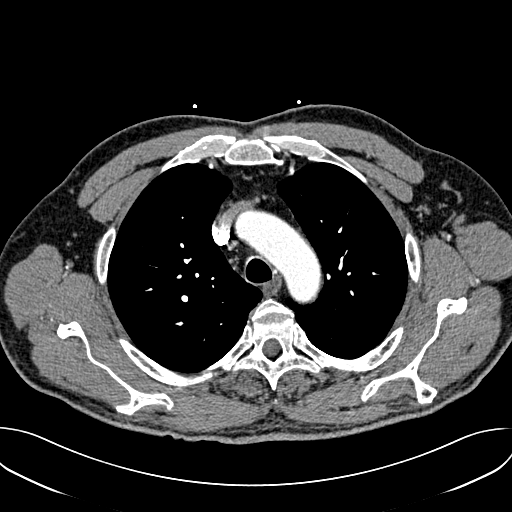
[im 267/321  lung]
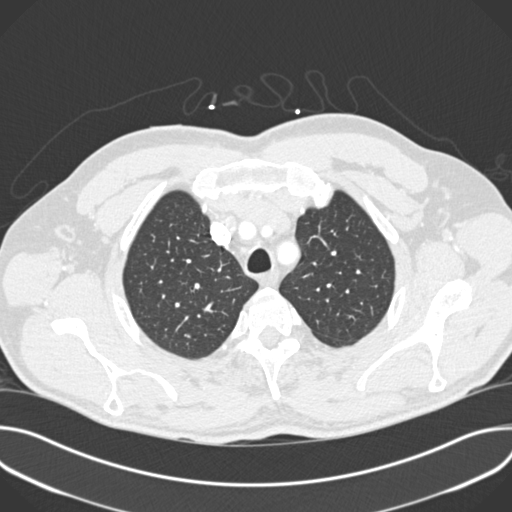
[im 285/321  mediastinal]
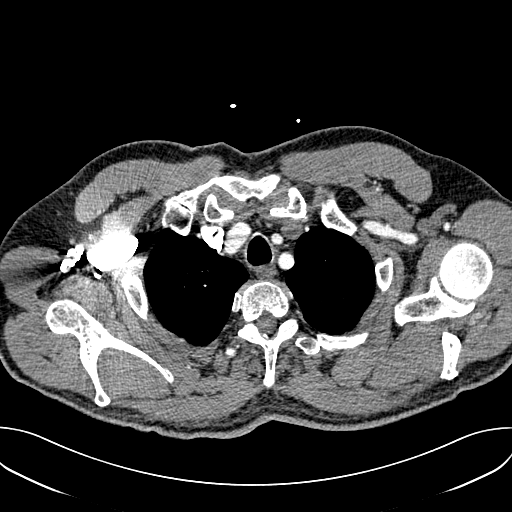
[im 303/321  lung]
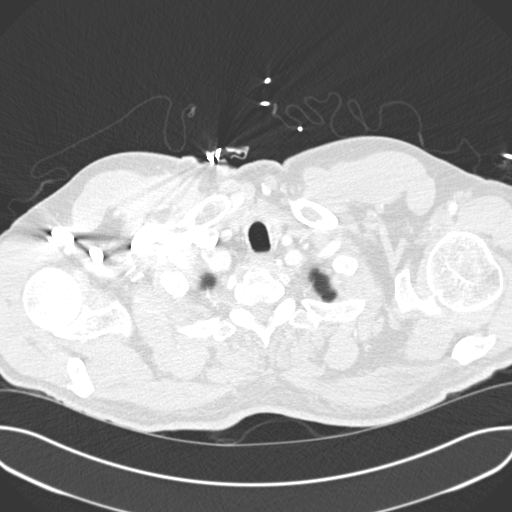

[Series 7: cor pe 2.0 mpr · coronal · 0.73mm/px · 1 of 141 slices shown]
[im 71/141  mediastinal]
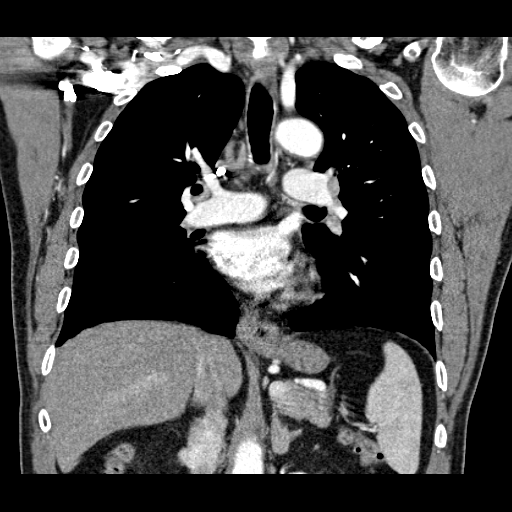

[18 of 36 positions shown; findings below may reference images not displayed]

FINDINGS: No filling defects in the pulmonary arteries to suggest pulmonary
emboli. Heart is normal size. Aorta is normal caliber. No
mediastinal, hilar, or axillary adenopathy. Chest wall soft tissues
are unremarkable.

Lungs are clear.  No pleural effusions.

Imaging into the upper abdomen shows no acute findings.

No acute bony abnormality or focal bone lesion.

Review of the MIP images confirms the above findings.
IMPRESSION: No acute findings.  No evidence of pulmonary embolus.

## 2016-05-03 IMAGING — CR NECK SOFT TISSUES - 1+ VIEW
1 series · 2 of 2 positions shown · non-contrast
Comparison: None.

CLINICAL DATA: Pain and difficulty breathing

EXAM:
NECK SOFT TISSUES - 1+ VIEW

[Series 1: dxr soft tissue neck · 0.14mm/px · 2 of 2 slices shown]
[im 1/2]
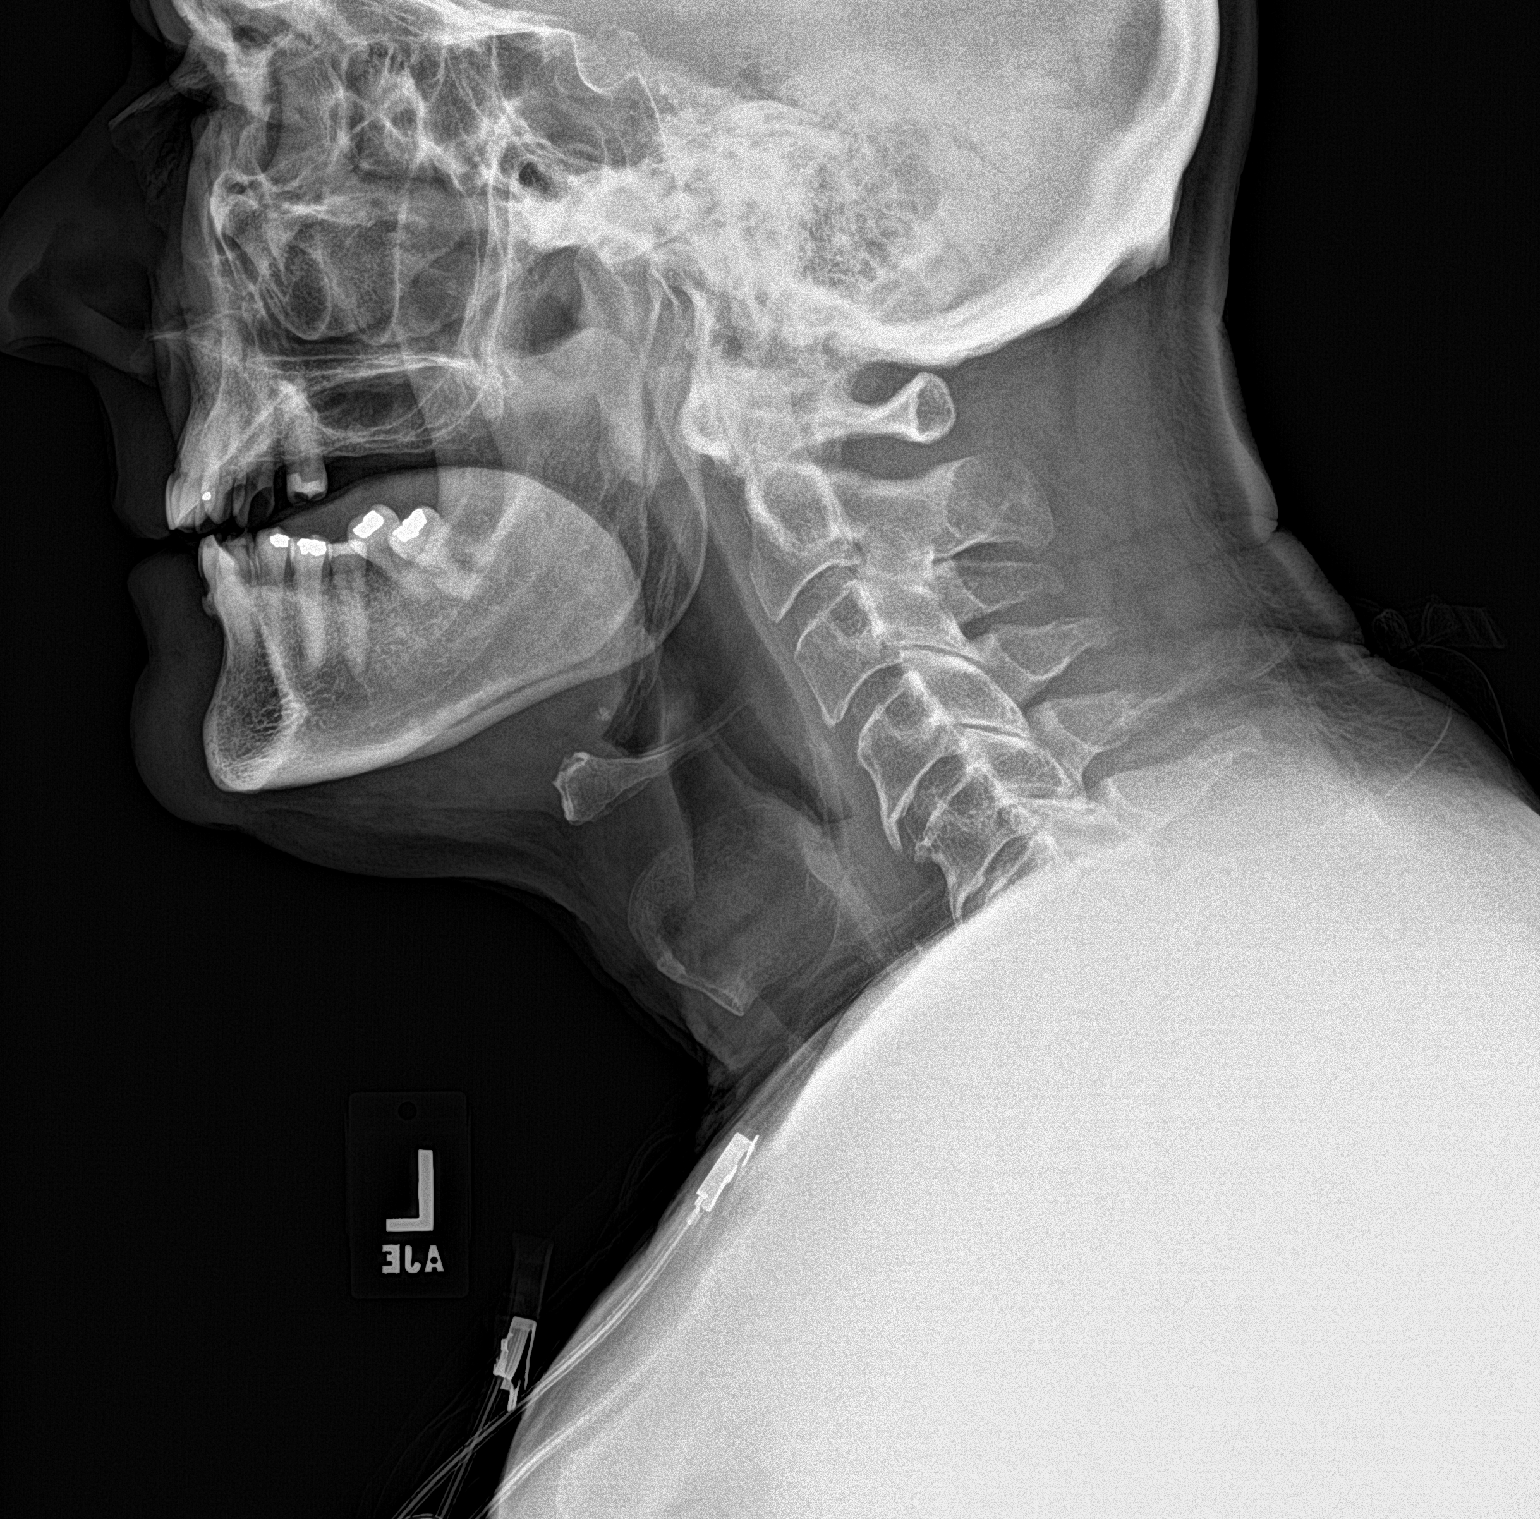
[im 2/2]
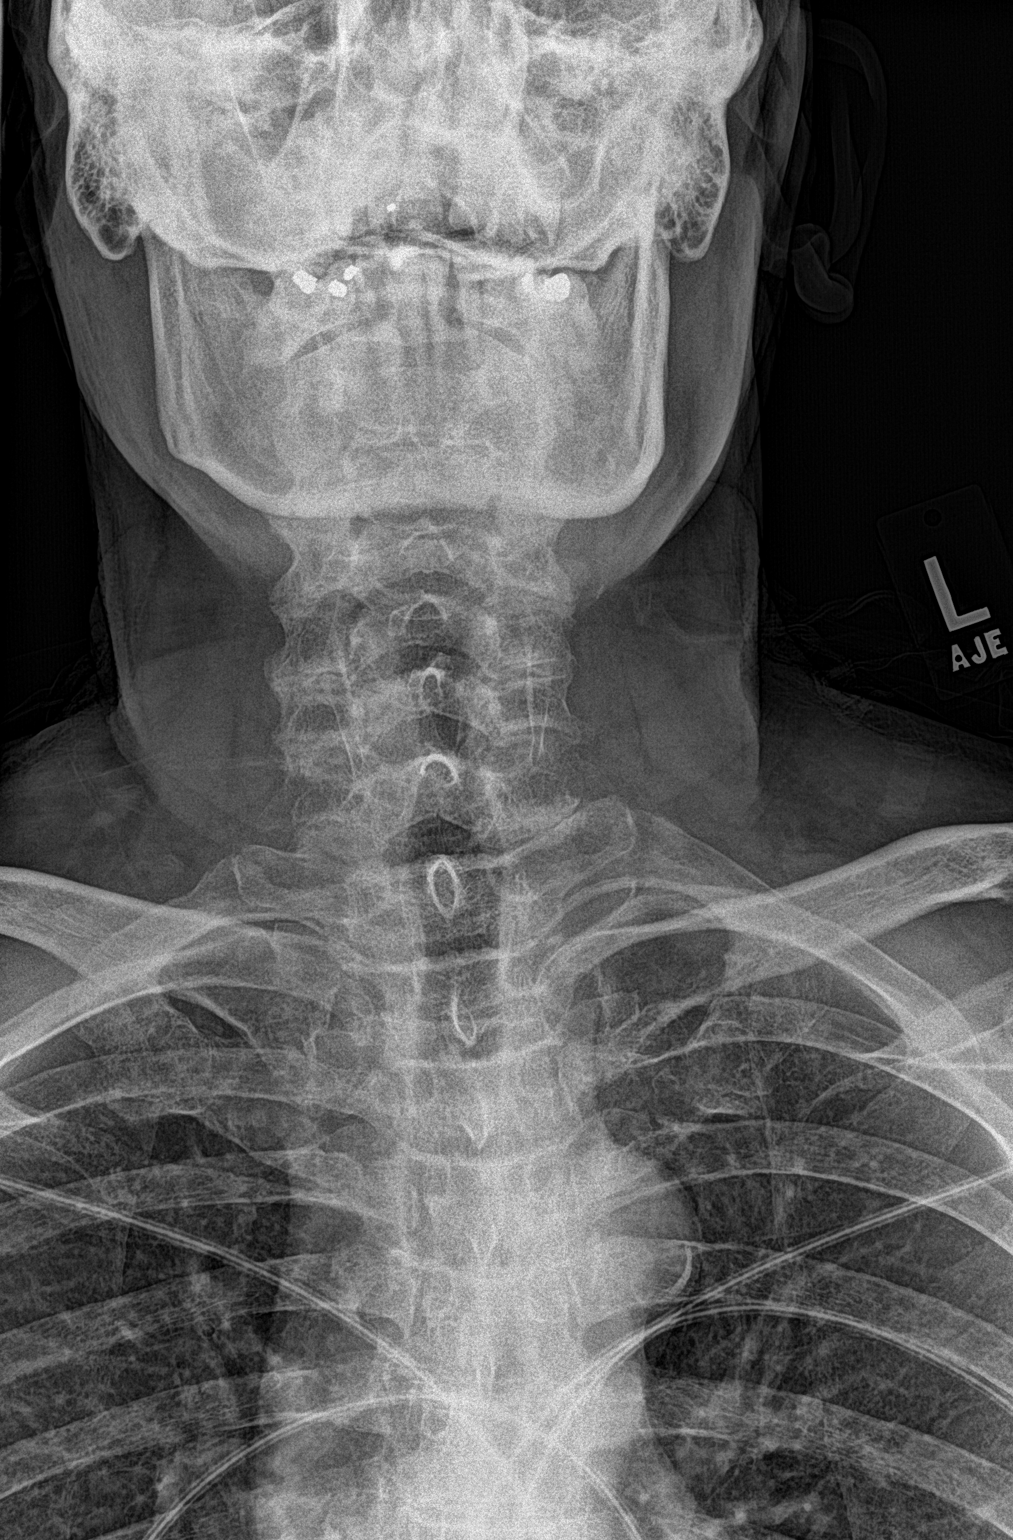

[2 of 2 positions shown; findings below may reference images not displayed]

FINDINGS: Frontal and lateral views were obtained. The epiglottis and
aryepiglottic folds appear normal. Prevertebral soft tissues appear
normal. No air-fluid level to suggest abscess. Tongue base region
appears normal. Tonsils and adenoidal structures appear normal. No
radiopaque foreign body. No tracheal air column narrowing. There is
degenerative change in the mid cervical spine.
IMPRESSION: Degenerative change mid cervical spine. Soft tissues appear normal.
Tracheal air column appears normal. Epiglottis and aryepiglottic
folds normal appearance.

## 2016-05-17 ENCOUNTER — Encounter: Payer: Self-pay | Admitting: Family

## 2016-05-17 ENCOUNTER — Ambulatory Visit (INDEPENDENT_AMBULATORY_CARE_PROVIDER_SITE_OTHER): Payer: 59 | Admitting: Family

## 2016-05-17 VITALS — BP 150/85 | HR 86 | Temp 97.6°F | Ht 75.0 in | Wt 197.6 lb

## 2016-05-17 DIAGNOSIS — R030 Elevated blood-pressure reading, without diagnosis of hypertension: Secondary | ICD-10-CM | POA: Diagnosis not present

## 2016-05-17 DIAGNOSIS — S80862A Insect bite (nonvenomous), left lower leg, initial encounter: Secondary | ICD-10-CM | POA: Diagnosis not present

## 2016-05-17 DIAGNOSIS — W57XXXA Bitten or stung by nonvenomous insect and other nonvenomous arthropods, initial encounter: Secondary | ICD-10-CM | POA: Diagnosis not present

## 2016-05-17 MED ORDER — DOXYCYCLINE HYCLATE 100 MG PO TABS: ORAL_TABLET | ORAL | 0 refills | Status: DC

## 2016-05-17 NOTE — Patient Instructions (Addendum)
Monitor blood pressure. If persistently greater than 135/85, please call our office to let Dr Allayne Butcher know as likely she would want to start a medication   please come in for nurse visit to recheck blood pressure and also to calibrate blood pressure cuff from home.  One dose doxycycline for prophylaxis.   Let's follow these tick bites with close observation. Information below on Lyme disease and Tick bites in general.   If there is no improvement in your symptoms, or if there is any worsening of symptoms, or if you have any additional concerns, please return for re-evaluation; or, if we are closed, consider going to the Emergency Room for evaluation if symptoms urgent.  Patient education: Lyme disease (The Basics)[facility] in [place]  Written by the doctors and Therapist, music at [employer]  What is Lyme disease? - Lyme disease is an illness that can make you feel like you have the flu. It can also cause a rash, fever, or nerve, joint, or heart problems. People can get Lyme disease after being bitten by a tiny insect called a tick. When a certain type of tick bites you, it can transmit the germ that causes Lyme disease from its body to yours. But a tick can infect you only if it stays attached for at least a day. The ticks that carry Lyme disease feed on deer and mice. Ticks are found in tall grass and on shrubs, and can attach to animals and people walking by. Ticks cannot fly or jump. What are the symptoms of Lyme disease? - Symptoms can start days or weeks after a tick bite. They include: ?A rash where you were bitten - The rash often appears within a month of getting bitten. It is red, but its center can be the color of your skin. It might get bigger over a few days. To some, it looks like a "bull's eye" (picture 1). ?Fever ?Feeling tired ?Body aches and pains ?Heart problems such as a slowed heart rate ?Headache and stiff neck ?Feelings of pain, weakness, or numbness If a person is not treated, further  symptoms can occur months to years after a tick bite. These include: ?Pain and swelling of joints, such as your knees ?Trouble with your memory and thinking ?Skin problems, such as skin swelling or thinning (this occurs mostly in [place]) Is there a test for Lyme disease? - Yes. Blood tests can show if you are infected with the germ that causes Lyme disease. But, it takes time for the blood tests to turn positive. This means the tests won't work if you get them right after being bitten. Also, sometimes the blood tests come back negative even when you have the rash that goes with Lyme disease. Because of this, if you have the rash, the blood test is not needed to confirm that you have Lyme disease. If your doctor or nurse suspects you have Lyme disease, he or she will do an exam and ask you questions. The doctor or nurse will use this information (and your blood test result, if needed) to decide about treatment. What should I do if I get bitten by a tick or if my child gets bitten? - If you find a tick on your body or on your child, use tweezers to grab it. Then pull it out slowly and gently. After that, wash the area with soap and water. You do not need to keep the tick. But knowing what it looked like can help your doctor decide about your treatment.  See if you can tell: ?Its color and size ?If it was attached to your skin or just resting on your skin ?If it was big, round, and full of blood (picture 2) You should watch the area around the bite for a month to see if a rash occurs. Should I see a doctor or nurse? - See your doctor or nurse if you have a tick and you cannot get it off or if you think you have had a tick attached for at least 36 hours (a day and a half). You should also see a doctor or nurse if you develop symptoms of Lyme disease. Some people don't know that they were bitten by a tick. Or they might not remember having a rash or early symptoms of Lyme disease. How is Lyme disease treated?  - Lyme disease is usually treated with antibiotics. Treatment with antibiotics should help your symptoms go away. Sometimes, symptoms improve quickly. Other times, it can take weeks or months for symptoms to go away. Your doctor might prescribe medicine for you to take right after a tick bite. Or your doctor might wait to see if you first develop symptoms. Either way, the medicine will treat your Lyme disease. What can I do to try to avoid getting bitten by a tick? - You can: ?Wear shoes, long-sleeved shirts, and long pants when you go outside. Keep ticks away from your skin by tucking your pants into your socks. ?Wear light colors so you can spot any ticks that get on your clothes ?Use bug sprays to keep ticks off your skin or clothes ?Shower within 2 hours of being outdoors if you think you have been in an area where there are ticks ?Check your clothes and body for ticks after being outdoors. Be sure to check your scalp, waist, armpits, groin, and backs of your knees. Check your children, too. ?If you live in a place that has deer or mice nearby, take steps to keep those animals away. Deer and mice carry ticks.   Tick Bite Information Ticks are insects that attach themselves to the skin and draw blood for food. There are various types of ticks. Common types include wood ticks and deer ticks. Most ticks live in shrubs and grassy areas. Ticks can climb onto your body when you make contact with leaves or grass where the tick is waiting. The most common places on the body for ticks to attach themselves are the scalp, neck, armpits, waist, and groin. Most tick bites are harmless, but sometimes ticks carry germs that cause diseases. These germs can be spread to a person during the tick's feeding process. The chance of a disease spreading through a tick bite depends on:   The type of tick.  Time of year.   How long the tick is attached.   Geographic location.  HOW CAN YOU PREVENT TICK  BITES? Take these steps to help prevent tick bites when you are outdoors:  Wear protective clothing. Long sleeves and long pants are best.   Wear white clothes so you can see ticks more easily.  Tuck your pant legs into your socks.   If walking on a trail, stay in the middle of the trail to avoid brushing against bushes.  Avoid walking through areas with long grass.  Put insect repellent on all exposed skin and along boot tops, pant legs, and sleeve cuffs.   Check clothing, hair, and skin repeatedly and before going inside.   Brush off any ticks that  are not attached.  Take a shower or bath as soon as possible after being outdoors.  WHAT IS THE PROPER WAY TO REMOVE A TICK? Ticks should be removed as soon as possible to help prevent diseases caused by tick bites. 1. If latex gloves are available, put them on before trying to remove a tick.  2. Using fine-point tweezers, grasp the tick as close to the skin as possible. You may also use curved forceps or a tick removal tool. Grasp the tick as close to its head as possible. Avoid grasping the tick on its body. 3. Pull gently with steady upward pressure until the tick lets go. Do not twist the tick or jerk it suddenly. This may break off the tick's head or mouth parts. 4. Do not squeeze or crush the tick's body. This could force disease-carrying fluids from the tick into your body.  5. After the tick is removed, wash the bite area and your hands with soap and water or other disinfectant such as alcohol. 6. Apply a small amount of antiseptic cream or ointment to the bite site.  7. Wash and disinfect any instruments that were used.  Do not try to remove a tick by applying a hot match, petroleum jelly, or fingernail polish to the tick. These methods do not work and may increase the chances of disease being spread from the tick bite.  WHEN SHOULD YOU SEEK MEDICAL CARE? Contact your health care provider if you are unable to remove a  tick from your skin or if a part of the tick breaks off and is stuck in the skin.  After a tick bite, you need to be aware of signs and symptoms that could be related to diseases spread by ticks. Contact your health care provider if you develop any of the following in the days or weeks after the tick bite:  Unexplained fever.  Rash. A circular rash that appears days or weeks after the tick bite may indicate the possibility of Lyme disease. The rash may resemble a target with a bull's-eye and may occur at a different part of your body than the tick bite.  Redness and swelling in the area of the tick bite.   Tender, swollen lymph glands.   Diarrhea.   Weight loss.   Cough.   Fatigue.   Muscle, joint, or bone pain.   Abdominal pain.   Headache.   Lethargy or a change in your level of consciousness.  Difficulty walking or moving your legs.   Numbness in the legs.   Paralysis.  Shortness of breath.   Confusion.   Repeated vomiting.    This information is not intended to replace advice given to you by your health care provider. Make sure you discuss any questions you have with your health care provider.   Document Released: 12/25/1999 Document Revised: 01/17/2014 Document Reviewed: 06/06/2012 Elsevier Interactive Patient Education [employer].

## 2016-05-17 NOTE — Assessment & Plan Note (Signed)
Elevated. No chest pain, shortness of breath. Patient wil monitor values at home and if persistently greater than 135 4/85, I advised him that he should start a medication follow-up with PCP.

## 2016-05-17 NOTE — Assessment & Plan Note (Signed)
Based on duration of tick attached; will treat with PPX. Reassured as no systemic features. Education provided regarding lyme disease.

## 2016-05-17 NOTE — Progress Notes (Signed)
Pre visit review using our clinic review tool, if applicable. No additional management support is needed unless otherwise documented below in the visit note. 

## 2016-05-17 NOTE — Progress Notes (Signed)
Subjective:    Patient ID: Craig Landry, male    DOB: 1950-04-14, 66 y.o.   MRN: [id number]  CC: Craig Landry is a 66 y.o. male who presents today for an acute visit.    HPI: CC: tick bite left thigh 6 days ago. Noted the tick 24 -48 hours after attached. Unsure if deer tick. Feels well.   Describes redness and Itchiness around bite. No bulls eye rash , arthralgia, fever, N, V, HA.   Blood pressure 'always high in doctors office. ' Has been treated in the past for HTN. Denies exertional chest pain or pressure, numbness or tingling radiating to left arm or jaw, palpitations, dizziness, frequent headaches, changes in vision, or shortness of breath.   Nonsmoker     HISTORY:  Past Medical History:  Diagnosis Date  . Glaucoma   . History of back injury 2004   work related injury ,  fell out of a truck   No past surgical history on file. Family History  Problem Relation Age of Onset  . Heart disease Mother   . Diabetes Mother   . Vision loss Father 61    temporal arteritis    Allergies: Patient has no known allergies. Current Outpatient Prescriptions on File Prior to Visit  Medication Sig Dispense Refill  . latanoprost (XALATAN) 0.005 % ophthalmic solution Place 1 drop into both eyes at bedtime.   4   No current facility-administered medications on file prior to visit.     Social History  Substance Use Topics  . Smoking status: Never Smoker  . Smokeless tobacco: Never Used  . Alcohol use No    Review of Systems  Constitutional: Negative for chills and fever.  Eyes: Negative for visual disturbance.  Respiratory: Negative for cough.   Cardiovascular: Negative for chest pain, palpitations and leg swelling.  Gastrointestinal: Negative for abdominal distention, nausea and vomiting.  Musculoskeletal: Negative for arthralgias and myalgias.  Skin: Negative for wound.  Neurological: Negative for headaches.      Objective:    BP (!) 150/85   Pulse 86   Temp 97.6  F (36.4 C) (Oral)   Ht 6\' 3"  (1.905 m)   Wt 197 lb 9.6 oz (89.6 kg)   SpO2 97%   BMI 24.70 kg/m  BP Readings from Last 3 Encounters:  05/17/16 (!) 150/85  01/29/14 130/80  12/25/13 (!) 158/84     Physical Exam  Constitutional: He appears well-developed and well-nourished.  Cardiovascular: Regular rhythm and normal heart sounds.   Pulmonary/Chest: Effort normal and breath sounds normal. No respiratory distress. He has no wheezes. He has no rhonchi. He has no rales.  Neurological: He is alert.  Skin: Skin is warm and dry.  Psychiatric: He has a normal mood and affect. His speech is normal and behavior is normal.  Vitals reviewed.      Assessment & Plan:   Problem List Items Addressed This Visit      Musculoskeletal and Integument   Tick bite of left lower leg - Primary    Based on duration of tick attached; will treat with PPX. Reassured as no systemic features. Education provided regarding lyme disease.       Relevant Medications   doxycycline (VIBRA-TABS) 100 MG tablet     Other   Elevated blood pressure reading    Elevated. No chest pain, shortness of breath. Patient wil monitor values at home and if persistently greater than 135 4/85, I advised him that he should start a  medication follow-up with PCP.           I have discontinued Mr. Stairs cholecalciferol and hydrochlorothiazide. I am also having him start on doxycycline. Additionally, I am having him maintain his latanoprost.   Meds ordered this encounter  Medications  . doxycycline (VIBRA-TABS) 100 MG tablet    Sig: Take two tablets by mouth ( total of 200 mg) once for lyme prophylaxis.    Dispense:  2 tablet    Refill:  0    Order Specific Question:   Supervising Provider    Answer:   Crecencio Mc [[id number]]    Return precautions given.   Risks, benefits, and alternatives of the medications and treatment plan prescribed today were discussed, and patient expressed understanding.   Education  regarding symptom management and diagnosis given to patient on AVS.  Continue to follow with Crecencio Mc, MD for routine health maintenance.   Lissa Hoard and I agreed with plan.   Mable Paris, FNP

## 2016-05-25 ENCOUNTER — Encounter: Payer: Self-pay | Admitting: *Deleted

## 2016-05-25 ENCOUNTER — Ambulatory Visit (INDEPENDENT_AMBULATORY_CARE_PROVIDER_SITE_OTHER): Payer: 59 | Admitting: *Deleted

## 2016-05-25 VITALS — BP 140/88 | HR 64 | Resp 14

## 2016-05-25 DIAGNOSIS — R030 Elevated blood-pressure reading, without diagnosis of hypertension: Secondary | ICD-10-CM | POA: Diagnosis not present

## 2016-05-25 NOTE — Progress Notes (Addendum)
Patient came into office for one week post OV with NP for tick bite for repeat BP check, Patient BP in left arm 136/94 pulse 62, patient BP in right arm 140/88 pulse 64. BP attained according to nursing standards. Patient BP reading with home cuff with 6 -8 points of same reading as nurse.  Copy of home readings placed in results patient has no pulse reading but BP advised patient to always give pulse with reading.   I have reviewed the above information and agree with above.   Deborra Medina, MD

## 2016-08-11 DIAGNOSIS — J069 Acute upper respiratory infection, unspecified: Secondary | ICD-10-CM | POA: Diagnosis not present

## 2016-08-16 ENCOUNTER — Other Ambulatory Visit: Payer: Self-pay | Admitting: Family Medicine

## 2016-08-16 ENCOUNTER — Encounter: Payer: Self-pay | Admitting: Family Medicine

## 2016-08-16 ENCOUNTER — Ambulatory Visit (INDEPENDENT_AMBULATORY_CARE_PROVIDER_SITE_OTHER): Payer: 59 | Admitting: Family Medicine

## 2016-08-16 VITALS — BP 160/98 | HR 81 | Temp 98.9°F | Wt 195.8 lb

## 2016-08-16 DIAGNOSIS — J329 Chronic sinusitis, unspecified: Secondary | ICD-10-CM | POA: Insufficient documentation

## 2016-08-16 DIAGNOSIS — R51 Headache: Secondary | ICD-10-CM

## 2016-08-16 DIAGNOSIS — R945 Abnormal results of liver function studies: Principal | ICD-10-CM

## 2016-08-16 DIAGNOSIS — R7989 Other specified abnormal findings of blood chemistry: Secondary | ICD-10-CM

## 2016-08-16 DIAGNOSIS — J0110 Acute frontal sinusitis, unspecified: Secondary | ICD-10-CM | POA: Diagnosis not present

## 2016-08-16 DIAGNOSIS — R519 Headache, unspecified: Secondary | ICD-10-CM

## 2016-08-16 LAB — COMPREHENSIVE METABOLIC PANEL
ALT: 124 U/L — AB (ref 0–53)
AST: 92 U/L — AB (ref 0–37)
Albumin: 3.8 g/dL (ref 3.5–5.2)
Alkaline Phosphatase: 146 U/L — ABNORMAL HIGH (ref 39–117)
BUN: 12 mg/dL (ref 6–23)
CALCIUM: 8.9 mg/dL (ref 8.4–10.5)
CO2: 29 meq/L (ref 19–32)
CREATININE: 0.88 mg/dL (ref 0.40–1.50)
Chloride: 102 mEq/L (ref 96–112)
GFR: 92.38 mL/min (ref >60.00)
GLUCOSE: 110 mg/dL — AB (ref 70–99)
Potassium: 3.8 mEq/L (ref 3.5–5.1)
Sodium: 137 mEq/L (ref 135–145)
Total Bilirubin: 1.7 mg/dL — ABNORMAL HIGH (ref 0.2–1.2)
Total Protein: 6.9 g/dL (ref 6.0–8.3)

## 2016-08-16 LAB — SEDIMENTATION RATE: Sed Rate: 31 mm/hr — ABNORMAL HIGH (ref 0–20)

## 2016-08-16 MED ORDER — AMOXICILLIN-POT CLAVULANATE 875-125 MG PO TABS
1.0000 | ORAL_TABLET | Freq: Two times a day (BID) | ORAL | 0 refills | Status: DC
Start: 2016-08-16 — End: 2017-12-12

## 2016-08-16 NOTE — Progress Notes (Signed)
  Tommi Rumps, MD Phone: [phone]  Craig Landry is a 66 y.o. male who presents today for same-day visit.  Patient notes over the last week he has had intermittent hot and cold chills with MAXIMUM TEMPERATURE of 99.65F. He notes most of his joints hurt and he has been achy. He has noted left frontal and temporal oral headache over that time period as well. Feels as though his left nostril is clogged up. He notes his throat has been dry and sore as he has been breathing more out of his mouth. No shortness of breath. Notes his wife had similar symptoms previously. He notes no numbness, weakness, or vision changes. He notes no eye erythema.  PMH: nonsmoker.   ROS see history of present illness  Objective  Physical Exam Vitals:   08/16/16 1308  BP: (!) 160/98  Pulse: 81  Temp: 98.9 F (37.2 C)    BP Readings from Last 3 Encounters:  08/16/16 (!) 160/98  05/25/16 140/88  05/17/16 (!) 150/85   Wt Readings from Last 3 Encounters:  08/16/16 195 lb 12.8 oz (88.8 kg)  05/17/16 197 lb 9.6 oz (89.6 kg)  01/29/14 202 lb 6.4 oz (91.8 kg)    Physical Exam  Constitutional: No distress.  HENT:  Head: Normocephalic and atraumatic.  Mouth/Throat: Oropharynx is clear and moist. No oropharyngeal exudate.  Mild left frontal tenderness to percussion, slight discomfort on palpation of the left temple, no other sinus tenderness, no discomfort of the right temple  Eyes: Pupils are equal, round, and reactive to light. Conjunctivae are normal.  Cardiovascular: Normal rate, regular rhythm and normal heart sounds.   Pulmonary/Chest: Effort normal. No respiratory distress. He has no wheezes.  Slight crackles on first pass of left lower lung field, though on repeated listening they would go away  Neurological: He is alert.  CN 2-12 intact, 5/5 strength in bilateral biceps, triceps, grip, quads, hamstrings, plantar and dorsiflexion, sensation to light touch intact in bilateral UE and LE, normal  gait  Skin: He is not diaphoretic.     Assessment/Plan: Please see individual problem list.  Sinusitis Suspect symptoms related to sinusitis particularly given left-sided nasal congestion. He did have sick contacts. We will treat with Augmentin for this. One concerning aspect is a slight discomfort on palpation of the left temple. Given unilateral headache with slight discomfort there could be concern for temporal arteritis though this would seem less likely in combination with his other symptoms and sick contacts. We'll check an ESR to rule this out. Given his achiness we'll check a CMP. Discussed obtaining a chest x-ray given the slight crackles though he opted for antibiotic treatment and recheck in 2 days. If not improving at that time could consider chest x-ray. Given return precautions. Advised he needs to follow-up with PCP for blood pressure and general health care.  Orders Placed This Encounter  Procedures  . Comp Met (CMET)  . Sed Rate (ESR)   Tommi Rumps, MD [place]

## 2016-08-16 NOTE — Patient Instructions (Signed)
Nice to see you. You likely have a sinus infection though we will obtain an inflammatory marker test to ensure that it is not significantly elevated as this could be contributing to your symptoms. If you develop worsening headache, or you develop numbness, weakness, vision changes, shortness of breath, or any new or changing symptoms please seek medical attention.

## 2016-08-16 NOTE — Assessment & Plan Note (Signed)
Suspect symptoms related to sinusitis particularly given left-sided nasal congestion. He did have sick contacts. We will treat with Augmentin for this. One concerning aspect is a slight discomfort on palpation of the left temple. Given unilateral headache with slight discomfort there could be concern for temporal arteritis though this would seem less likely in combination with his other symptoms and sick contacts. We'll check an ESR to rule this out. Given his achiness we'll check a CMP. Discussed obtaining a chest x-ray given the slight crackles though he opted for antibiotic treatment and recheck in 2 days. If not improving at that time could consider chest x-ray. Given return precautions.

## 2016-08-18 ENCOUNTER — Encounter: Payer: Self-pay | Admitting: Family Medicine

## 2016-08-18 ENCOUNTER — Telehealth: Payer: Self-pay | Admitting: Internal Medicine

## 2016-08-18 ENCOUNTER — Ambulatory Visit (INDEPENDENT_AMBULATORY_CARE_PROVIDER_SITE_OTHER): Payer: 59 | Admitting: Family Medicine

## 2016-08-18 DIAGNOSIS — J0110 Acute frontal sinusitis, unspecified: Secondary | ICD-10-CM

## 2016-08-18 DIAGNOSIS — R7989 Other specified abnormal findings of blood chemistry: Secondary | ICD-10-CM | POA: Insufficient documentation

## 2016-08-18 DIAGNOSIS — R945 Abnormal results of liver function studies: Principal | ICD-10-CM

## 2016-08-18 LAB — HEPATIC FUNCTION PANEL
ALT: 97 U/L — ABNORMAL HIGH (ref 0–53)
AST: 46 U/L — AB (ref 0–37)
Albumin: 3.9 g/dL (ref 3.5–5.2)
Alkaline Phosphatase: 178 U/L — ABNORMAL HIGH (ref 39–117)
BILIRUBIN TOTAL: 1.5 mg/dL — AB (ref 0.2–1.2)
Bilirubin, Direct: 0.3 mg/dL (ref 0.0–0.3)
Total Protein: 7 g/dL (ref 6.0–8.3)

## 2016-08-18 NOTE — Patient Instructions (Signed)
Nice to see you. I am glad you are feeling better. Please finish the course of antibiotics. If you start to feel worse or develop fevers, cough productive of blood, trouble breathing, or any new change in symptoms please seek medical attention.

## 2016-08-18 NOTE — Assessment & Plan Note (Signed)
Possibly related to acute illness though he does report a history of Gilbert's. We'll recheck today.

## 2016-08-18 NOTE — Assessment & Plan Note (Signed)
Improved. He'll finish his course of antibiotics. He does still have slight crackles in the left lower lung field though given improvement with antibiotic even if this is a pneumonia we would proceed with treatment with his current antibiotic as he has improved. He'll monitor his symptoms. If he worsens he'll be reevaluated.

## 2016-08-18 NOTE — Progress Notes (Signed)
  Tommi Rumps, MD Phone: [phone]  Craig Landry is a 66 y.o. male who presents today for follow-up.  Patient seen 2 days ago for headache and likely sinus infection. Placed on Augmentin. Notes he is feeling quite a bit better. The headache is significantly improved. His congestion is quite a bit improved. He has not had any fevers. Rare cough. He overall feels a lot better.  His LFTs were slightly elevated on check 2 days ago as well. He does have a history of Gilbert's. He notes no abdominal pain. No nausea or vomiting. Due for recheck today.  ROS see history of present illness  Objective  Physical Exam Vitals:   08/18/16 0817  BP: 140/86  Pulse: 70  Temp: 97.9 F (36.6 C)    BP Readings from Last 3 Encounters:  08/18/16 140/86  08/16/16 (!) 160/98  05/25/16 140/88   Wt Readings from Last 3 Encounters:  08/18/16 193 lb (87.5 kg)  08/16/16 195 lb 12.8 oz (88.8 kg)  05/17/16 197 lb 9.6 oz (89.6 kg)    Physical Exam  Constitutional: No distress.  HENT:  Head: Normocephalic and atraumatic.  Cardiovascular: Normal rate, regular rhythm and normal heart sounds.   Pulmonary/Chest: Effort normal. No respiratory distress. He has no wheezes. He has no rales.  Minimal left lower lung field crackles  Abdominal: Soft. Bowel sounds are normal. He exhibits no distension. There is no tenderness.  Skin: He is not diaphoretic.     Assessment/Plan: Please see individual problem list.  Sinusitis Improved. He'll finish his course of antibiotics. He does still have slight crackles in the left lower lung field though given improvement with antibiotic even if this is a pneumonia we would proceed with treatment with his current antibiotic as he has improved. He'll monitor his symptoms. If he worsens he'll be reevaluated.  Elevated LFTs Possibly related to acute illness though he does report a history of Gilbert's. We'll recheck today.  Tommi Rumps, MD [place]

## 2016-08-18 NOTE — Telephone Encounter (Signed)
See result note.  

## 2016-08-18 NOTE — Telephone Encounter (Signed)
Pt called back returning your call. Please advise, thank you!  Call pt @ [phone]

## 2016-08-19 ENCOUNTER — Other Ambulatory Visit: Payer: Self-pay | Admitting: Family Medicine

## 2016-08-19 DIAGNOSIS — R945 Abnormal results of liver function studies: Principal | ICD-10-CM

## 2016-08-19 DIAGNOSIS — R7989 Other specified abnormal findings of blood chemistry: Secondary | ICD-10-CM

## 2016-08-29 ENCOUNTER — Other Ambulatory Visit (INDEPENDENT_AMBULATORY_CARE_PROVIDER_SITE_OTHER): Payer: 59

## 2016-08-29 DIAGNOSIS — R945 Abnormal results of liver function studies: Principal | ICD-10-CM

## 2016-08-29 DIAGNOSIS — R7989 Other specified abnormal findings of blood chemistry: Secondary | ICD-10-CM

## 2016-08-29 LAB — HEPATIC FUNCTION PANEL
ALT: 27 U/L (ref 0–53)
AST: 18 U/L (ref 0–37)
Albumin: 3.7 g/dL (ref 3.5–5.2)
Alkaline Phosphatase: 118 U/L — ABNORMAL HIGH (ref 39–117)
BILIRUBIN TOTAL: 1.1 mg/dL (ref 0.2–1.2)
Bilirubin, Direct: 0.3 mg/dL (ref 0.0–0.3)
Total Protein: 6.9 g/dL (ref 6.0–8.3)

## 2017-01-16 DIAGNOSIS — H401131 Primary open-angle glaucoma, bilateral, mild stage: Secondary | ICD-10-CM | POA: Diagnosis not present

## 2017-01-23 DIAGNOSIS — H401131 Primary open-angle glaucoma, bilateral, mild stage: Secondary | ICD-10-CM | POA: Diagnosis not present

## 2017-09-28 DIAGNOSIS — H401131 Primary open-angle glaucoma, bilateral, mild stage: Secondary | ICD-10-CM | POA: Diagnosis not present

## 2017-12-12 ENCOUNTER — Ambulatory Visit: Payer: 59 | Admitting: Cardiovascular Disease

## 2017-12-12 ENCOUNTER — Encounter: Payer: Self-pay | Admitting: Cardiovascular Disease

## 2017-12-12 DIAGNOSIS — R079 Chest pain, unspecified: Secondary | ICD-10-CM | POA: Diagnosis not present

## 2017-12-12 NOTE — Progress Notes (Signed)
Cardiology Office Note   Date:  12/12/2017   ID:  Craig Landry, DOB 07/30/1950, MRN [id number]  PCP:  Craig Mc, MD  Cardiologist:   Craig Sacramento, MD   Chief Complaint  Patient presents with  . New Patient (Initial Visit)    Tightness in chest. Medciations reviewed verbally.       History of Present Illness: Craig Landry is a 67 y.o. male who is self-referred for evaluation of chest pain.  He has no prior cardiac history and has been overall healthy throughout his life with no hypertension, diabetes, hyperlipidemia or tobacco use. Unfortunately, his 70 year old son died unexpectedly last month.  He presented with shortness of breath of 10 days duration and was found to be in A. fib with RVR but then developed PEA arrest.  No definite diagnosis was reached. The patient has been under increased stress since then with worsening chest pain.  He describes substernal chest discomfort described as dullness and aching sensation that happens at rest and is not exertional.  It is intermittent and lasts for few minutes.  He does report exertional dyspnea with no orthopnea, PND or leg edema. His father did have a CABG in his early 20s.  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 He is not aware of any previous cardiac history.    Past Medical History:  Diagnosis Date  . Gilbert's syndrome   . Glaucoma   . History of back injury 2004   work related injury ,  fell out of a truck    History reviewed. No pertinent surgical history.   Current Outpatient Medications  Medication Sig Dispense Refill  . latanoprost (XALATAN) 0.005 % ophthalmic solution Place 1 drop into both eyes at bedtime.   4  . Omeprazole-Sodium Bicarbonate (ZEGERID PO) Take by mouth.     No current facility-administered medications for this visit.     Allergies:   Patient has no known allergies.    Social History:  The patient  reports that he has never smoked. He has never used smokeless  tobacco. He reports that he does not drink alcohol or use drugs.   Family History:  The patient's family history includes Diabetes in his mother; Heart disease in his mother; Vision loss (age of onset: 57) in his father.    ROS:  Please see the history of present illness.   Otherwise, review of systems are positive for none.   All other systems are reviewed and negative.    PHYSICAL EXAM: VS:  BP 140/86 (BP Location: Right Arm, Patient Position: Sitting, Cuff Size: Normal)   Ht 6\' 3"  (1.905 m)   Wt 198 lb (89.8 kg)   BMI 24.75 kg/m  , BMI Body mass index is 24.75 kg/m. GEN: Well nourished, well developed, in no acute distress  HEENT: normal  Neck: no JVD, carotid bruits, or masses Cardiac: RRR; no murmurs, rubs, or gallops,no edema  Respiratory:  clear to auscultation bilaterally, normal work of breathing GI: soft, nontender, nondistended, + BS MS: no deformity or atrophy  Skin: warm and dry, no rash Neuro:  Strength and sensation are intact Psych: euthymic mood, full affect   EKG:  EKG is ordered today. The ekg ordered today demonstrates normal sinus rhythm with possible left atrial enlargement.  No significant ST or T wave.   Recent Labs: No results found for requested labs within last 8760 hours.    Lipid Panel    Component Value Date/Time  CHOL 154 07/26/2013 0819   TRIG 154.0 (H) 07/26/2013 0819   HDL 36.40 (L) 07/26/2013 0819   CHOLHDL 4 07/26/2013 0819   VLDL 30.8 07/26/2013 0819   LDLCALC 87 07/26/2013 0819      Wt Readings from Last 3 Encounters:  12/12/17 198 lb (89.8 kg)  08/18/16 193 lb (87.5 kg)  08/16/16 195 lb 12.8 oz (88.8 kg)       PAD Screen 12/12/2017  Previous PAD dx? No  Previous surgical procedure? No  Pain with walking? No  Feet/toe relief with dangling? No  Painful, non-healing ulcers? No  Extremities discolored? No      ASSESSMENT AND PLAN:  1.  Atypical chest pain: His symptoms are overall atypical but he does have family  history of coronary artery disease and reports some exertional dyspnea.  Due to that, I recommend evaluation with a treadmill Myoview.  2.  Mildly elevated blood pressure without history of hypertension: Continue to monitor for now.  Disposition:   FU with me as needed  Signed,  Craig Sacramento, MD  12/12/2017 2:38 PM    [place]

## 2017-12-12 NOTE — Patient Instructions (Signed)
Medication Instructions:  No changes  If you need a refill on your cardiac medications before your next appointment, please call your pharmacy.   Lab work: None ordered  Testing/Procedures: Your physician has requested that you have an exercise stress myoview. For further information please visit [email or web address]. Please follow instruction sheet, as given.  Follow-Up: Follow up as needed with Dr. Fletcher Anon.  Any Other Special Instructions Will Be Listed Below (If Applicable).  [place]  Your provider has ordered a Stress Test with nuclear imaging. The purpose of this test is to evaluate the blood supply to your heart muscle. This procedure is referred to as a "Non-Invasive Stress Test." This is because other than having an IV started in your vein, nothing is inserted or "invades" your body. Cardiac stress tests are done to find areas of poor blood flow to the heart by determining the extent of coronary artery disease (CAD). Some patients exercise on a treadmill, which naturally increases the blood flow to your heart, while others who are unable to walk on a treadmill due to physical limitations have a pharmacologic/chemical stress agent called Lexiscan . This medicine will mimic walking on a treadmill by temporarily increasing your coronary blood flow.   Please note: these test may take anywhere between 2-4 hours to complete  PLEASE REPORT TO [facility] AT THE FIRST DESK WILL DIRECT YOU WHERE TO GO  Date of Procedure:_____________________________________  Arrival Time for Procedure:______________________________  Instructions regarding medication:  None to hold.  PLEASE NOTIFY THE OFFICE AT LEAST 74 HOURS IN ADVANCE IF YOU ARE UNABLE TO KEEP YOUR APPOINTMENT.  [phone] AND  PLEASE NOTIFY NUCLEAR MEDICINE AT [facility] AT LEAST 24 HOURS IN ADVANCE IF YOU ARE UNABLE TO KEEP YOUR APPOINTMENT. [phone]  How to prepare for your Myoview  test:  1. Do not eat or drink after midnight 2. No caffeine for 24 hours prior to test 3. No smoking 24 hours prior to test. 4. Your medication may be taken with water.  If your doctor stopped a medication because of this test, do not take that medication. 5. Ladies, please do not wear dresses.  Skirts or pants are appropriate. Please wear a short sleeve shirt. 6. No perfume, cologne or lotion. 7. Wear comfortable walking shoes. No heels!

## 2017-12-15 ENCOUNTER — Encounter
Admission: RE | Admit: 2017-12-15 | Discharge: 2017-12-15 | Disposition: A | Discharge status: Home / Self Care | Payer: 59 | Source: Ambulatory Visit | Attending: Cardiovascular Disease | Admitting: Cardiovascular Disease

## 2017-12-15 DIAGNOSIS — R079 Chest pain, unspecified: Secondary | ICD-10-CM | POA: Diagnosis not present

## 2017-12-15 LAB — NM MYOCAR MULTI W/SPECT W/WALL MOTION / EF
CSEPED: 6 min
CSEPEDS: 46 s
CSEPHR: 97 %
Estimated workload: 8.1 METS
LV sys vol: 30 mL
LVDIAVOL: 96 mL (ref 62–150)
MPHR: 154 {beats}/min
Peak HR: 150 {beats}/min
Rest HR: 68 {beats}/min
SDS: 7
SRS: 0
SSS: 7
TID: 1

## 2017-12-15 MED ORDER — TECHNETIUM TC 99M TETROFOSMIN IV KIT
31.6700 | PACK | Freq: Once | INTRAVENOUS | Status: AC | PRN
  Administered 2017-12-15: 31.67 via INTRAVENOUS

## 2017-12-15 MED ORDER — TECHNETIUM TC 99M TETROFOSMIN IV KIT
10.8600 | PACK | Freq: Once | INTRAVENOUS | Status: AC | PRN
  Administered 2017-12-15: 10.86 via INTRAVENOUS

## 2017-12-20 ENCOUNTER — Ambulatory Visit: Payer: Self-pay

## 2017-12-20 NOTE — Telephone Encounter (Signed)
FYI patient going to [facility] for chest  Pain with SOB.

## 2017-12-20 NOTE — Telephone Encounter (Signed)
Pt c/o over a month of mild constant chest "aching." Pt c/o "feeling like I'm drowning when I lay down." Pt stated that he is having so SOB at rest, just when he lays flat. Pt's son died last month from a heart attack and has a strong family h/o heart issues. Pt stated he is in no distress. Called PCP office and spoke to Harmony who advised to send pt to [facility] or the ED.  Reason for Disposition . Difficulty breathing . Chest pain lasts > 5 minutes (Exceptions: chest pain occurring > 3 days ago and now asymptomatic; same as previously diagnosed heartburn and has accompanying sour taste in mouth)  Answer Assessment - Initial Assessment Questions 1. LOCATION: "Where does it hurt?"       From breast bone on left hand side going to shoulder  2. RADIATION: "Does the pain go anywhere else?" (e.g., into neck, jaw, arms, back)     Left shoulder 3. ONSET: "When did the chest pain begin?" (Minutes, hours or days)      Over a month ago 4. PATTERN "Does the pain come and go, or has it been constant since it started?"  "Does it get worse with exertion?"      Constant- no 5. DURATION: "How long does it last" (e.g., seconds, minutes, hours)     constant 6. SEVERITY: "How bad is the pain?"  (e.g., Scale 1-10; mild, moderate, or severe)    - MILD (1-3): doesn't interfere with normal activities     - MODERATE (4-7): interferes with normal activities or awakens from sleep    - SEVERE (8-10): excruciating pain, unable to do any normal activities       mild 7. CARDIAC RISK FACTORS: "Do you have any history of heart problems or risk factors for heart disease?" (e.g., prior heart attack, angina; high blood pressure, diabetes, being overweight, high cholesterol, smoking, or strong family history of heart disease)     "white coat syndrome" son dies of heart attaack and Dad with bypass in late and brother died of heart attack 8. PULMONARY RISK FACTORS: "Do you have any history of lung disease?"  (e.g., blood clots in lung,  asthma, emphysema, birth control pills)     no 9. CAUSE: "What do you think is causing the chest pain?"     stress 10. OTHER SYMPTOMS: "Do you have any other symptoms?" (e.g., dizziness, nausea, vomiting, sweating, fever, difficulty breathing, cough)       When pt lays down is having shortness of breath or feels like he is drowning 11. PREGNANCY: "Is there any chance you are pregnant?" "When was your last menstrual period?"       n/a  Protocols used: CHEST PAIN-A-AH

## 2018-02-22 DIAGNOSIS — H903 Sensorineural hearing loss, bilateral: Secondary | ICD-10-CM | POA: Diagnosis not present

## 2018-02-22 DIAGNOSIS — H9319 Tinnitus, unspecified ear: Secondary | ICD-10-CM | POA: Diagnosis not present

## 2018-03-14 DIAGNOSIS — H903 Sensorineural hearing loss, bilateral: Secondary | ICD-10-CM | POA: Diagnosis not present

## 2019-01-24 DIAGNOSIS — H401131 Primary open-angle glaucoma, bilateral, mild stage: Secondary | ICD-10-CM | POA: Diagnosis not present

## 2019-01-31 DIAGNOSIS — H2513 Age-related nuclear cataract, bilateral: Secondary | ICD-10-CM | POA: Diagnosis not present

## 2019-07-31 DIAGNOSIS — H401131 Primary open-angle glaucoma, bilateral, mild stage: Secondary | ICD-10-CM | POA: Diagnosis not present

## 2020-01-30 DIAGNOSIS — H401131 Primary open-angle glaucoma, bilateral, mild stage: Secondary | ICD-10-CM | POA: Diagnosis not present

## 2020-02-06 DIAGNOSIS — H401131 Primary open-angle glaucoma, bilateral, mild stage: Secondary | ICD-10-CM | POA: Diagnosis not present

## 2020-08-13 DIAGNOSIS — H401131 Primary open-angle glaucoma, bilateral, mild stage: Secondary | ICD-10-CM | POA: Diagnosis not present

## 2021-01-28 DIAGNOSIS — H401131 Primary open-angle glaucoma, bilateral, mild stage: Secondary | ICD-10-CM | POA: Diagnosis not present

## 2021-02-04 DIAGNOSIS — H401131 Primary open-angle glaucoma, bilateral, mild stage: Secondary | ICD-10-CM | POA: Diagnosis not present

## 2021-08-11 DIAGNOSIS — H401131 Primary open-angle glaucoma, bilateral, mild stage: Secondary | ICD-10-CM | POA: Diagnosis not present

## 2021-08-11 DIAGNOSIS — H2513 Age-related nuclear cataract, bilateral: Secondary | ICD-10-CM | POA: Diagnosis not present

## 2021-11-10 DIAGNOSIS — H401131 Primary open-angle glaucoma, bilateral, mild stage: Secondary | ICD-10-CM | POA: Diagnosis not present

## 2021-11-18 DIAGNOSIS — H2511 Age-related nuclear cataract, right eye: Secondary | ICD-10-CM | POA: Diagnosis not present

## 2021-11-23 ENCOUNTER — Encounter: Payer: Self-pay | Admitting: Ophthalmology

## 2021-11-25 NOTE — Discharge Instructions (Signed)

## 2021-11-29 ENCOUNTER — Ambulatory Visit
Admission: RE | Admit: 2021-11-29 | Discharge: 2021-11-29 | Disposition: A | Discharge status: Home / Self Care | Payer: Medicare HMO | Attending: Ophthalmology | Admitting: Ophthalmology

## 2021-11-29 ENCOUNTER — Other Ambulatory Visit: Payer: Self-pay

## 2021-11-29 ENCOUNTER — Encounter
Admission: RE | Disposition: A | Discharge status: Home / Self Care | Payer: Self-pay | Source: Home / Self Care | Attending: Ophthalmology

## 2021-11-29 ENCOUNTER — Ambulatory Visit: Payer: Medicare HMO | Admitting: Anesthesiology

## 2021-11-29 ENCOUNTER — Encounter: Payer: Self-pay | Admitting: Ophthalmology

## 2021-11-29 DIAGNOSIS — G709 Myoneural disorder, unspecified: Secondary | ICD-10-CM | POA: Insufficient documentation

## 2021-11-29 DIAGNOSIS — H409 Unspecified glaucoma: Secondary | ICD-10-CM | POA: Insufficient documentation

## 2021-11-29 DIAGNOSIS — K219 Gastro-esophageal reflux disease without esophagitis: Secondary | ICD-10-CM | POA: Diagnosis not present

## 2021-11-29 DIAGNOSIS — H2511 Age-related nuclear cataract, right eye: Secondary | ICD-10-CM | POA: Diagnosis not present

## 2021-11-29 DIAGNOSIS — I10 Essential (primary) hypertension: Secondary | ICD-10-CM | POA: Diagnosis not present

## 2021-11-29 DIAGNOSIS — Z79899 Other long term (current) drug therapy: Secondary | ICD-10-CM | POA: Diagnosis not present

## 2021-11-29 HISTORY — PX: CATARACT EXTRACTION W/PHACO: SHX586

## 2021-11-29 HISTORY — DX: Gastro-esophageal reflux disease without esophagitis: K21.9

## 2021-11-29 HISTORY — DX: Polyneuropathy, unspecified: G62.9

## 2021-11-29 SURGERY — PHACOEMULSIFICATION, CATARACT, WITH IOL INSERTION
Anesthesia: Monitor Anesthesia Care | Site: Eye | Laterality: Right

## 2021-11-29 MED ORDER — FENTANYL CITRATE (PF) 100 MCG/2ML IJ SOLN
INTRAMUSCULAR | Status: DC | PRN
  Administered 2021-11-29: 100 ug via INTRAVENOUS

## 2021-11-29 MED ORDER — MIDAZOLAM HCL 2 MG/2ML IJ SOLN
INTRAMUSCULAR | Status: DC | PRN
  Administered 2021-11-29: 2 mg via INTRAVENOUS

## 2021-11-29 MED ORDER — SIGHTPATH DOSE#1 BSS IO SOLN
INTRAOCULAR | Status: DC | PRN
  Administered 2021-11-29: 85 mL via OPHTHALMIC

## 2021-11-29 MED ORDER — SIGHTPATH DOSE#1 BSS IO SOLN
INTRAOCULAR | Status: DC | PRN
  Administered 2021-11-29: 15 mL

## 2021-11-29 MED ORDER — MOXIFLOXACIN HCL 0.5 % OP SOLN
OPHTHALMIC | Status: DC | PRN
  Administered 2021-11-29: .2 mL via OPHTHALMIC

## 2021-11-29 MED ORDER — SIGHTPATH DOSE#1 NA HYALUR & NA CHOND-NA HYALUR IO KIT
PACK | INTRAOCULAR | Status: DC | PRN
  Administered 2021-11-29: 1 via OPHTHALMIC

## 2021-11-29 MED ORDER — LIDOCAINE HCL (PF) 2 % IJ SOLN
INTRAOCULAR | Status: DC | PRN
  Administered 2021-11-29: 1 mL via INTRAOCULAR

## 2021-11-29 MED ORDER — TETRACAINE HCL 0.5 % OP SOLN
1.0000 [drp] | OPHTHALMIC | Status: DC | PRN
  Administered 2021-11-29 (×3): 1 [drp] via OPHTHALMIC

## 2021-11-29 MED ORDER — ARMC OPHTHALMIC DILATING DROPS
1.0000 | OPHTHALMIC | Status: DC | PRN
  Administered 2021-11-29 (×3): 1 via OPHTHALMIC

## 2021-11-29 SURGICAL SUPPLY — 13 items
CATARACT SUITE SIGHTPATH (MISCELLANEOUS) ×1 IMPLANT
DISSECTOR HYDRO NUCLEUS 50X22 (MISCELLANEOUS) ×1 IMPLANT
FEE CATARACT SUITE SIGHTPATH (MISCELLANEOUS) ×1 IMPLANT
GLOVE SURG GAMMEX PI TX LF 7.5 (GLOVE) ×1 IMPLANT
GLOVE SURG SYN 8.5  E (GLOVE) ×1
GLOVE SURG SYN 8.5 E (GLOVE) ×1 IMPLANT
GLOVE SURG SYN 8.5 PF PI (GLOVE) ×1 IMPLANT
LENS IOL TECNIS EYHANCE 16.0 (Intraocular Lens) IMPLANT
NDL FILTER BLUNT 18X1 1/2 (NEEDLE) ×1 IMPLANT
NEEDLE FILTER BLUNT 18X1 1/2 (NEEDLE) ×1 IMPLANT
SYR 3ML LL SCALE MARK (SYRINGE) ×1 IMPLANT
SYR 5ML LL (SYRINGE) ×1 IMPLANT
WATER STERILE IRR 250ML POUR (IV SOLUTION) ×1 IMPLANT

## 2021-11-29 NOTE — Transfer of Care (Signed)
Immediate Anesthesia Transfer of Care Note  Patient: Craig Landry  Procedure(s) Performed: CATARACT EXTRACTION PHACO AND INTRAOCULAR LENS PLACEMENT (IOC) RIGHT (Right: Eye)  Patient Location: PACU  Anesthesia Type: MAC  Level of Consciousness: awake, alert  and patient cooperative  Airway and Oxygen Therapy: Patient Spontanous Breathing and Patient connected to supplemental oxygen  Post-op Assessment: Post-op Vital signs reviewed, Patient's Cardiovascular Status Stable, Respiratory Function Stable, Patent Airway and No signs of Nausea or vomiting  Post-op Vital Signs: Reviewed and stable  Complications: There were no known notable events for this encounter.

## 2021-11-29 NOTE — H&P (Signed)
[facility]   Primary Care Physician:  Crecencio Mc, MD Ophthalmologist: Dr. Benay Pillow  Pre-Procedure History & Physical: HPI:  Craig Landry is a 71 y.o. male here for cataract surgery.   Past Medical History:  Diagnosis Date   GERD (gastroesophageal reflux disease)    Gilbert's syndrome    Glaucoma    History of back injury 2004   work related injury ,  fell out of a truck   Neuropathy    left leg S/P back injury 2004    Past Surgical History:  Procedure Laterality Date   ESOPHAGOGASTRODUODENOSCOPY      Prior to Admission medications   Medication Sig Start Date End Date Taking? Authorizing Provider  latanoprost (XALATAN) 0.005 % ophthalmic solution Place 1 drop into both eyes at bedtime.  11/12/13  Yes [provider]  omeprazole (PRILOSEC) 20 MG capsule Take 20 mg by mouth daily.   Yes [provider]  SUPER B COMPLEX/C PO Take by mouth daily.   Yes [provider]    Allergies as of 11/18/2021   (No Known Allergies)    Family History  Problem Relation Age of Onset   Heart disease Mother    Diabetes Mother    Vision loss Father 71       temporal arteritis    Social History   Socioeconomic History   Marital status: Married    Spouse name: Not on file   Number of children: Not on file   Years of education: Not on file   Highest education level: Not on file  Occupational History   Occupation: SERVICE ENGINEER    Employer: [employer]    Comment: Production designer, theatre/television/film,  uses software   Tobacco Use   Smoking status: Never   Smokeless tobacco: Never  Vaping Use   Vaping Use: Never used  Substance and Sexual Activity   Alcohol use: No   Drug use: No   Sexual activity: Not on file  Other Topics Concern   Not on file  Social History Narrative   Not on file   Social Determinants of Health   Financial Resource Strain: Not on file  Food Insecurity: Not on file  Transportation Needs: Not on file  Physical  Activity: Not on file  Stress: Not on file  Social Connections: Not on file  Intimate Partner Violence: Not on file    Review of Systems: See HPI, otherwise negative ROS  Physical Exam: BP (!) 167/95   Pulse 61   Temp (!) 96.1 F (35.6 C) (Temporal)   Resp 20   Ht '6\' 2"'$  (1.88 m)   Wt 88.5 kg   SpO2 97%   BMI 25.04 kg/m  General:   Alert, cooperative in NAD Head:  Normocephalic and atraumatic. Respiratory:  Normal work of breathing. Cardiovascular:  RRR  Impression/Plan: Craig Landry is here for cataract surgery.  Risks, benefits, limitations, and alternatives regarding cataract surgery have been reviewed with the patient.  Questions have been answered.  All parties agreeable.   Benay Pillow, MD  11/29/2021, 8:26 AM

## 2021-11-29 NOTE — Anesthesia Preprocedure Evaluation (Addendum)
Anesthesia Evaluation  Patient identified by MRN, date of birth, ID band Patient awake    Reviewed: Allergy & Precautions, NPO status , Patient's Chart, lab work & pertinent test results  History of Anesthesia Complications Negative for: history of anesthetic complications  Airway Mallampati: III  TM Distance: >3 FB Neck ROM: full    Dental  (+) Chipped, Poor Dentition   Pulmonary neg pulmonary ROS   Pulmonary exam normal        Cardiovascular negative cardio ROS Normal cardiovascular exam     Neuro/Psych  Neuromuscular disease  negative psych ROS   GI/Hepatic Neg liver ROS,GERD  ,,Gilbert's syndrome    Endo/Other  negative endocrine ROS    Renal/GU      Musculoskeletal   Abdominal   Peds  Hematology negative hematology ROS (+)   Anesthesia Other Findings Past Medical History: No date: GERD (gastroesophageal reflux disease) No date: Gilbert's syndrome No date: Glaucoma 2004: History of back injury     Comment:  work related injury ,  fell out of a truck No date: Neuropathy     Comment:  left leg S/P back injury 2004  Past Surgical History: No date: ESOPHAGOGASTRODUODENOSCOPY  BMI    Body Mass Index: 25.68 kg/m      Reproductive/Obstetrics negative OB ROS                             Anesthesia Physical Anesthesia Plan  ASA: 2  Anesthesia Plan: MAC   Post-op Pain Management:    Induction: Intravenous  PONV Risk Score and Plan:   Airway Management Planned: Natural Airway and Nasal Cannula  Additional Equipment:   Intra-op Plan:   Post-operative Plan:   Informed Consent: I have reviewed the patients History and Physical, chart, labs and discussed the procedure including the risks, benefits and alternatives for the proposed anesthesia with the patient or authorized representative who has indicated his/her understanding and acceptance.     Dental Advisory  Given  Plan Discussed with: Anesthesiologist, CRNA and Surgeon  Anesthesia Plan Comments: (Patient consented for risks of anesthesia including but not limited to:  - adverse reactions to medications - damage to eyes, teeth, lips or other oral mucosa - nerve damage due to positioning  - sore throat or hoarseness - Damage to heart, brain, nerves, lungs, other parts of body or loss of life  Patient voiced understanding.)       Anesthesia Quick Evaluation

## 2021-11-29 NOTE — Anesthesia Postprocedure Evaluation (Signed)
Anesthesia Post Note  Patient: Craig Landry  Procedure(s) Performed: CATARACT EXTRACTION PHACO AND INTRAOCULAR LENS PLACEMENT (IOC) RIGHT (Right: Eye)  Patient location during evaluation: PACU Anesthesia Type: MAC Level of consciousness: awake and alert Pain management: pain level controlled Vital Signs Assessment: post-procedure vital signs reviewed and stable Respiratory status: spontaneous breathing, nonlabored ventilation, respiratory function stable and patient connected to nasal cannula oxygen Cardiovascular status: stable and blood pressure returned to baseline Postop Assessment: no apparent nausea or vomiting Anesthetic complications: no   There were no known notable events for this encounter.   Last Vitals:  Vitals:   11/29/21 0745 11/29/21 0852  BP: (!) 167/95 (!) 128/96  Pulse: 61 (!) 53  Resp: 20 18  Temp: (!) 35.6 C 36.6 C  SpO2: 97% 95%    Last Pain:  Vitals:   11/29/21 0852  TempSrc:   PainSc: 0-No pain                 Ilene Qua

## 2021-11-29 NOTE — Op Note (Signed)
OPERATIVE NOTE  Craig Landry [id number] 11/29/2021   PREOPERATIVE DIAGNOSIS:  Nuclear sclerotic cataract right eye.  H25.11   POSTOPERATIVE DIAGNOSIS:    Nuclear sclerotic cataract right eye.     PROCEDURE:  Phacoemusification with posterior chamber intraocular lens placement of the right eye   LENS:  * No implants in log *     Procedure(s) with comments: CATARACT EXTRACTION PHACO AND INTRAOCULAR LENS PLACEMENT (IOC) RIGHT (Right) - 3.00 0:24.3  DIB00 +16.0   SURGEON:  Benay Pillow, MD, MPH  ANESTHESIOLOGIST: Anesthesiologist: Ilene Qua, MD CRNA: Patience Musca., CRNA   ANESTHESIA:  Topical with tetracaine drops augmented with 1% preservative-free intracameral lidocaine.  ESTIMATED BLOOD LOSS: less than 1 mL.   COMPLICATIONS:  None.   DESCRIPTION OF PROCEDURE:  The patient was identified in the holding room and transported to the operating room and placed in the supine position under the operating microscope.  The right eye was identified as the operative eye and it was prepped and draped in the usual sterile ophthalmic fashion.   A 1.0 millimeter clear-corneal paracentesis was made at the 10:30 position. 0.5 ml of preservative-free 1% lidocaine with epinephrine was injected into the anterior chamber.  The anterior chamber was filled with viscoelastic.  A 2.4 millimeter keratome was used to make a near-clear corneal incision at the 8:00 position.  A curvilinear capsulorrhexis was made with a cystotome and capsulorrhexis forceps.  Balanced salt solution was used to hydrodissect and hydrodelineate the nucleus.   Phacoemulsification was then used in stop and chop fashion to remove the lens nucleus and epinucleus.  The remaining cortex was then removed using the irrigation and aspiration handpiece. Viscoelastic was then placed into the capsular bag to distend it for lens placement.  A lens was then injected into the capsular bag.  The remaining viscoelastic was  aspirated.   Wounds were hydrated with balanced salt solution.  The anterior chamber was inflated to a physiologic pressure with balanced salt solution.   Intracameral vigamox 0.1 mL undiluted was injected into the eye and a drop placed onto the ocular surface.  No wound leaks were noted.  The patient was taken to the recovery room in stable condition without complications of anesthesia or surgery  Benay Pillow 11/29/2021, 8:52 AM

## 2021-11-30 ENCOUNTER — Encounter: Payer: Self-pay | Admitting: Ophthalmology

## 2021-12-10 NOTE — Discharge Instructions (Signed)

## 2021-12-13 ENCOUNTER — Other Ambulatory Visit: Payer: Self-pay

## 2021-12-13 ENCOUNTER — Ambulatory Visit: Payer: Medicare HMO | Admitting: Anesthesiology

## 2021-12-13 ENCOUNTER — Encounter: Payer: Self-pay | Admitting: Ophthalmology

## 2021-12-13 ENCOUNTER — Encounter
Admission: RE | Disposition: A | Discharge status: Home / Self Care | Payer: Self-pay | Source: Home / Self Care | Attending: Ophthalmology

## 2021-12-13 ENCOUNTER — Ambulatory Visit
Admission: RE | Admit: 2021-12-13 | Discharge: 2021-12-13 | Disposition: A | Discharge status: Home / Self Care | Payer: Medicare HMO | Attending: Ophthalmology | Admitting: Ophthalmology

## 2021-12-13 DIAGNOSIS — H2512 Age-related nuclear cataract, left eye: Secondary | ICD-10-CM | POA: Insufficient documentation

## 2021-12-13 DIAGNOSIS — H409 Unspecified glaucoma: Secondary | ICD-10-CM | POA: Diagnosis not present

## 2021-12-13 DIAGNOSIS — K219 Gastro-esophageal reflux disease without esophagitis: Secondary | ICD-10-CM | POA: Diagnosis not present

## 2021-12-13 HISTORY — PX: CATARACT EXTRACTION W/PHACO: SHX586

## 2021-12-13 SURGERY — PHACOEMULSIFICATION, CATARACT, WITH IOL INSERTION
Anesthesia: Monitor Anesthesia Care | Site: Eye | Laterality: Left

## 2021-12-13 MED ORDER — TETRACAINE HCL 0.5 % OP SOLN
1.0000 [drp] | OPHTHALMIC | Status: DC | PRN
  Administered 2021-12-13 (×3): 1 [drp] via OPHTHALMIC

## 2021-12-13 MED ORDER — FENTANYL CITRATE (PF) 100 MCG/2ML IJ SOLN
INTRAMUSCULAR | Status: DC | PRN
  Administered 2021-12-13 (×2): 50 ug via INTRAVENOUS

## 2021-12-13 MED ORDER — SIGHTPATH DOSE#1 NA HYALUR & NA CHOND-NA HYALUR IO KIT
PACK | INTRAOCULAR | Status: DC | PRN
  Administered 2021-12-13: 1 via OPHTHALMIC

## 2021-12-13 MED ORDER — MIDAZOLAM HCL 2 MG/2ML IJ SOLN
INTRAMUSCULAR | Status: DC | PRN
  Administered 2021-12-13: 2 mg via INTRAVENOUS

## 2021-12-13 MED ORDER — MOXIFLOXACIN HCL 0.5 % OP SOLN
OPHTHALMIC | Status: DC | PRN
  Administered 2021-12-13: .2 mL via OPHTHALMIC

## 2021-12-13 MED ORDER — LIDOCAINE HCL (PF) 2 % IJ SOLN
INTRAOCULAR | Status: DC | PRN
  Administered 2021-12-13: 1 mL via INTRAOCULAR

## 2021-12-13 MED ORDER — ARMC OPHTHALMIC DILATING DROPS
1.0000 | OPHTHALMIC | Status: DC | PRN
  Administered 2021-12-13 (×3): 1 via OPHTHALMIC

## 2021-12-13 MED ORDER — SIGHTPATH DOSE#1 BSS IO SOLN
INTRAOCULAR | Status: DC | PRN
  Administered 2021-12-13: 97 mL via OPHTHALMIC

## 2021-12-13 MED ORDER — SIGHTPATH DOSE#1 BSS IO SOLN
INTRAOCULAR | Status: DC | PRN
  Administered 2021-12-13: 15 mL

## 2021-12-13 SURGICAL SUPPLY — 13 items
CATARACT SUITE SIGHTPATH (MISCELLANEOUS) ×1 IMPLANT
DISSECTOR HYDRO NUCLEUS 50X22 (MISCELLANEOUS) ×1 IMPLANT
FEE CATARACT SUITE SIGHTPATH (MISCELLANEOUS) ×1 IMPLANT
GLOVE SURG GAMMEX PI TX LF 7.5 (GLOVE) ×1 IMPLANT
GLOVE SURG SYN 8.5  E (GLOVE) ×1
GLOVE SURG SYN 8.5 E (GLOVE) ×1 IMPLANT
GLOVE SURG SYN 8.5 PF PI (GLOVE) ×1 IMPLANT
LENS IOL TECNIS EYHANCE 16.0 (Intraocular Lens) IMPLANT
NDL FILTER BLUNT 18X1 1/2 (NEEDLE) ×1 IMPLANT
NEEDLE FILTER BLUNT 18X1 1/2 (NEEDLE) ×1 IMPLANT
SYR 3ML LL SCALE MARK (SYRINGE) ×1 IMPLANT
SYR 5ML LL (SYRINGE) ×1 IMPLANT
WATER STERILE IRR 250ML POUR (IV SOLUTION) ×1 IMPLANT

## 2021-12-13 NOTE — Anesthesia Preprocedure Evaluation (Signed)
Anesthesia Evaluation  Patient identified by MRN, date of birth, ID band Patient awake    Reviewed: Allergy & Precautions, NPO status , Patient's Chart, lab work & pertinent test results  History of Anesthesia Complications Negative for: history of anesthetic complications  Airway Mallampati: III  TM Distance: >3 FB Neck ROM: full    Dental  (+) Chipped, Poor Dentition   Pulmonary neg pulmonary ROS   Pulmonary exam normal        Cardiovascular negative cardio ROS Normal cardiovascular exam     Neuro/Psych  Neuromuscular disease  negative psych ROS   GI/Hepatic Neg liver ROS,GERD  ,,Gilbert's syndrome    Endo/Other  negative endocrine ROS    Renal/GU      Musculoskeletal   Abdominal   Peds  Hematology negative hematology ROS (+)   Anesthesia Other Findings Past Medical History: No date: GERD (gastroesophageal reflux disease) No date: Gilbert's syndrome No date: Glaucoma 2004: History of back injury     Comment:  work related injury ,  fell out of a truck No date: Neuropathy     Comment:  left leg S/P back injury 2004  Past Surgical History: No date: ESOPHAGOGASTRODUODENOSCOPY  BMI    Body Mass Index: 25.68 kg/m      Reproductive/Obstetrics negative OB ROS                             Anesthesia Physical Anesthesia Plan  ASA: 2  Anesthesia Plan: MAC   Post-op Pain Management:    Induction: Intravenous  PONV Risk Score and Plan: 1 and Midazolam and Treatment may vary due to age or medical condition  Airway Management Planned: Natural Airway and Nasal Cannula  Additional Equipment:   Intra-op Plan:   Post-operative Plan:   Informed Consent: I have reviewed the patients History and Physical, chart, labs and discussed the procedure including the risks, benefits and alternatives for the proposed anesthesia with the patient or authorized representative who has indicated  his/her understanding and acceptance.     Dental Advisory Given  Plan Discussed with: Anesthesiologist, CRNA and Surgeon  Anesthesia Plan Comments: (Patient consented for risks of anesthesia including but not limited to:  - adverse reactions to medications - damage to eyes, teeth, lips or other oral mucosa - nerve damage due to positioning  - sore throat or hoarseness - Damage to heart, brain, nerves, lungs, other parts of body or loss of life  Patient voiced understanding.)       Anesthesia Quick Evaluation

## 2021-12-13 NOTE — Op Note (Signed)
OPERATIVE NOTE  Craig Landry [id number] 12/13/2021   PREOPERATIVE DIAGNOSIS:  Nuclear sclerotic cataract left eye.  H25.12   POSTOPERATIVE DIAGNOSIS:    Nuclear sclerotic cataract left eye.     PROCEDURE:  Phacoemusification with posterior chamber intraocular lens placement of the left eye   LENS:   Implant Name Type Inv. Item Serial No. Manufacturer Lot No. LRB No. Used Action  LENS IOL TECNIS EYHANCE 16.0 - [id number] Intraocular Lens LENS IOL TECNIS EYHANCE 16.0 [id number] [employer]  Left 1 Implanted      Procedure(s): CATARACT EXTRACTION PHACO AND INTRAOCULAR LENS PLACEMENT (IOC) LEFT  4.34  00:32.0 (Left)  DIB00 +16.0   ULTRASOUND TIME: 0 minutes 32 seconds.  CDE 4.34   SURGEON:  Benay Pillow, MD, MPH   ANESTHESIA:  Topical with tetracaine drops augmented with 1% preservative-free intracameral lidocaine.  ESTIMATED BLOOD LOSS: <1 mL   COMPLICATIONS:  None.   DESCRIPTION OF PROCEDURE:  The patient was identified in the holding room and transported to the operating room and placed in the supine position under the operating microscope.  The left eye was identified as the operative eye and it was prepped and draped in the usual sterile ophthalmic fashion.   A 1.0 millimeter clear-corneal paracentesis was made at the 5:00 position. 0.5 ml of preservative-free 1% lidocaine with epinephrine was injected into the anterior chamber.  The anterior chamber was filled with viscoelastic.  A 2.4 millimeter keratome was used to make a near-clear corneal incision at the 2:00 position.  A curvilinear capsulorrhexis was made with a cystotome and capsulorrhexis forceps.  Balanced salt solution was used to hydrodissect and hydrodelineate the nucleus.   Phacoemulsification was then used in stop and chop fashion to remove the lens nucleus and epinucleus.  The remaining cortex was then removed using the irrigation and aspiration handpiece. Viscoelastic was then placed into the capsular bag to  distend it for lens placement.  A lens was then injected into the capsular bag.  The remaining viscoelastic was aspirated.   Wounds were hydrated with balanced salt solution.  The anterior chamber was inflated to a physiologic pressure with balanced salt solution.  Intracameral vigamox 0.1 mL undiltued was injected into the eye and a drop placed onto the ocular surface.  No wound leaks were noted.  The patient was taken to the recovery room in stable condition without complications of anesthesia or surgery  Benay Pillow 12/13/2021, 8:24 AM

## 2021-12-13 NOTE — H&P (Signed)
[facility]   Primary Care Physician:  Crecencio Mc, MD Ophthalmologist: Dr. Benay Pillow  Pre-Procedure History & Physical: HPI:  Craig Landry is a 71 y.o. male here for cataract surgery.   Past Medical History:  Diagnosis Date   GERD (gastroesophageal reflux disease)    Gilbert's syndrome    Glaucoma    History of back injury 2004   work related injury ,  fell out of a truck   Neuropathy    left leg S/P back injury 2004    Past Surgical History:  Procedure Laterality Date   CATARACT EXTRACTION W/PHACO Right 11/29/2021   Procedure: CATARACT EXTRACTION PHACO AND INTRAOCULAR LENS PLACEMENT ([place]) RIGHT;  Surgeon: Eulogio Bear, MD;  Location: [place];  Service: Ophthalmology;  Laterality: Right;  3.00 0:24.3   ESOPHAGOGASTRODUODENOSCOPY      Prior to Admission medications   Medication Sig Start Date End Date Taking? Authorizing Provider  latanoprost (XALATAN) 0.005 % ophthalmic solution Place 1 drop into both eyes at bedtime.  11/12/13  Yes [provider]  omeprazole (PRILOSEC) 20 MG capsule Take 20 mg by mouth daily.   Yes [provider]  SUPER B COMPLEX/C PO Take by mouth daily.   Yes [provider]    Allergies as of 11/18/2021   (No Known Allergies)    Family History  Problem Relation Age of Onset   Heart disease Mother    Diabetes Mother    Vision loss Father 5       temporal arteritis    Social History   Socioeconomic History   Marital status: Married    Spouse name: Not on file   Number of children: Not on file   Years of education: Not on file   Highest education level: Not on file  Occupational History   Occupation: SERVICE ENGINEER    Employer: [employer]    Comment: Production designer, theatre/television/film,  uses software   Tobacco Use   Smoking status: Never   Smokeless tobacco: Never  Vaping Use   Vaping Use: Never used  Substance and Sexual Activity   Alcohol use: No   Drug use: No   Sexual  activity: Not on file  Other Topics Concern   Not on file  Social History Narrative   Not on file   Social Determinants of Health   Financial Resource Strain: Not on file  Food Insecurity: Not on file  Transportation Needs: Not on file  Physical Activity: Not on file  Stress: Not on file  Social Connections: Not on file  Intimate Partner Violence: Not on file    Review of Systems: See HPI, otherwise negative ROS  Physical Exam: BP (!) 168/96   Pulse (!) 56   Temp 97.9 F (36.6 C) (Temporal)   Resp 13   Ht '6\' 2"'$  (1.88 m)   Wt 88.5 kg   SpO2 98%   BMI 25.04 kg/m  General:   Alert, cooperative in NAD Head:  Normocephalic and atraumatic. Respiratory:  Normal work of breathing. Cardiovascular:  RRR  Impression/Plan: Craig Landry is here for cataract surgery.  Risks, benefits, limitations, and alternatives regarding cataract surgery have been reviewed with the patient.  Questions have been answered.  All parties agreeable.   Benay Pillow, MD  12/13/2021, 7:56 AM

## 2021-12-13 NOTE — Anesthesia Postprocedure Evaluation (Signed)
Anesthesia Post Note  Patient: Craig Landry  Procedure(s) Performed: CATARACT EXTRACTION PHACO AND INTRAOCULAR LENS PLACEMENT (IOC) LEFT  4.34  00:32.0 (Left: Eye)  Patient location during evaluation: PACU Anesthesia Type: MAC Level of consciousness: awake and alert Pain management: pain level controlled Vital Signs Assessment: post-procedure vital signs reviewed and stable Respiratory status: spontaneous breathing, nonlabored ventilation, respiratory function stable and patient connected to nasal cannula oxygen Cardiovascular status: stable and blood pressure returned to baseline Postop Assessment: no apparent nausea or vomiting Anesthetic complications: no   No notable events documented.   Last Vitals:  Vitals:   12/13/21 0825 12/13/21 0830  BP: 139/87 (!) 145/86  Pulse: (!) 58 (!) 57  Resp: 11 15  Temp: (!) 36.1 C (!) 36.1 C  SpO2: 97% 97%    Last Pain:  Vitals:   12/13/21 0830  TempSrc:   PainSc: 0-No pain                 Martha Clan

## 2021-12-13 NOTE — Transfer of Care (Signed)
Immediate Anesthesia Transfer of Care Note  Patient: Craig Landry  Procedure(s) Performed: CATARACT EXTRACTION PHACO AND INTRAOCULAR LENS PLACEMENT (IOC) LEFT  4.34  00:32.0 (Left: Eye)  Patient Location: PACU  Anesthesia Type: MAC  Level of Consciousness: awake, alert  and patient cooperative  Airway and Oxygen Therapy: Patient Spontanous Breathing and Patient connected to supplemental oxygen  Post-op Assessment: Post-op Vital signs reviewed, Patient's Cardiovascular Status Stable, Respiratory Function Stable, Patent Airway and No signs of Nausea or vomiting  Post-op Vital Signs: Reviewed and stable  Complications: No notable events documented.

## 2021-12-14 ENCOUNTER — Encounter: Payer: Self-pay | Admitting: Ophthalmology

## 2022-01-05 DIAGNOSIS — Z961 Presence of intraocular lens: Secondary | ICD-10-CM | POA: Diagnosis not present

## 2022-04-21 DIAGNOSIS — H401131 Primary open-angle glaucoma, bilateral, mild stage: Secondary | ICD-10-CM | POA: Diagnosis not present

## 2022-04-21 DIAGNOSIS — Z961 Presence of intraocular lens: Secondary | ICD-10-CM | POA: Diagnosis not present

## 2022-04-21 DIAGNOSIS — H43813 Vitreous degeneration, bilateral: Secondary | ICD-10-CM | POA: Diagnosis not present

## 2022-09-26 DIAGNOSIS — M67472 Ganglion, left ankle and foot: Secondary | ICD-10-CM | POA: Diagnosis not present

## 2022-09-26 NOTE — Progress Notes (Signed)
 CC:  Chief Complaint  Patient presents with  . New Patient    Patient visiting today for left foot ulcer on the right side of the foot.    Craig Landry is a 72 y.o. with a complaint of a knot on the inside of his left foot that has been present for the last couple of months.  States it does seem to be getting slightly larger.  He denies any injury or any significant pain.   Pt has tried the following treatment(s): None.     PMH: Past Medical History:  Diagnosis Date  . Erosive esophagitis 01/09/14   LA GRADE D    Medication: Current Outpatient Medications on File Prior to Visit  Medication Sig Dispense Refill  . cholecalciferol (VITAMIN D3) 1,000 unit tablet Take 1,000 Units by mouth continuously as needed.     . fluticasone (FLONASE) 50 mcg/actuation nasal spray Place 1 spray into both nostrils 2 (two) times daily. 16 g 0  . hydrocodone-chlorpheniramine (TUSSIONEX) 10-8 mg/5 mL ER suspension Take 5 mLs by mouth every 12 (twelve) hours as needed for Cough. 120 mL 0  . latanoprost (XALATAN) 0.005 % ophthalmic solution Place 1 drop into both eyes once daily.     . naproxen (NAPROSYN) 500 MG tablet Take 1 tablet (500 mg total) by mouth 2 (two) times daily as needed (pain). 30 tablet 0  . ondansetron (ZOFRAN-ODT) 4 MG disintegrating tablet Take 1 tablet (4 mg total) by mouth every 8 (eight) hours as needed for Nausea. 20 tablet 0  . pantoprazole (PROTONIX) 40 MG DR tablet TAKE ONE TABLET BY MOUTH TWICE DAILY 45 MINUTES BEFORE EATING 60 tablet 0   No current facility-administered medications on file prior to visit.    Allergies: Allergies as of 09/26/2022  . (No Known Allergies)    Surgical History: Past Surgical History:  Procedure Laterality Date  . EGD  01/07/14   repeat 4 weeks, office visit 01/23/14  . UPPER GASTROINTESTINAL ENDOSCOPY      Social History: Social History   Socioeconomic History  . Marital status: Married  Tobacco Use  . Smoking status: Never  .  Smokeless tobacco: Never   Social History   Tobacco Use  Smoking Status Never  Smokeless Tobacco Never      Review of Systems:  A comprehensive review of systems is documented elsewehere in the encounter.   Review of Systems : Review of systems is documented in this chart under nurses   Objective: Constitutional: General appearance is well with no acute distress.  Normal mood and affect. Vascular:Left foot:  Dorsalis Pedis:  present Posterior Tibial:  present      Right foot:  Dorsalis Pedis:  present Posterior Tibial:  present  Neuro:  Epicritic sensations grossly intact  Derm:  The skin is warm dry and supple.  No focal erythema, edema, or ecchymosis   Ortho/MS:Painfree ROM to ankle, subtalar, midtarsal, and metatarsalphalangeal joints.  Full muscle strength to all major muscle groups of the lower extremity.  There is a palpable cystic area noted on the dorsal medial aspect of the right first metatarsal base area.  Movable within the soft tissues and does transilluminate light.  Xrays None  Assessment: Encounter Diagnosis  Name Primary?  . Ganglion cyst of foot Yes    Plan: Discussed with the patient that this does appear to be a ganglion on cyst.  Discussed treatment options including aspiration of the cyst as well as that if it becomes more of a problem  in the future would more likely recommend removal.  At this point since it is not really painful we will just observe for any changes.  Patient will return to clinic as needed if any continued problems.  No orders of the defined types were placed in this encounter.   Return if symptoms worsen or fail to improve.

## 2022-10-27 DIAGNOSIS — H401131 Primary open-angle glaucoma, bilateral, mild stage: Secondary | ICD-10-CM | POA: Diagnosis not present

## 2022-10-27 DIAGNOSIS — Z961 Presence of intraocular lens: Secondary | ICD-10-CM | POA: Diagnosis not present

## 2023-04-27 DIAGNOSIS — H40003 Preglaucoma, unspecified, bilateral: Secondary | ICD-10-CM | POA: Diagnosis not present

## 2023-05-04 DIAGNOSIS — H43813 Vitreous degeneration, bilateral: Secondary | ICD-10-CM | POA: Diagnosis not present

## 2023-05-04 DIAGNOSIS — H401131 Primary open-angle glaucoma, bilateral, mild stage: Secondary | ICD-10-CM | POA: Diagnosis not present

## 2023-05-04 DIAGNOSIS — Z961 Presence of intraocular lens: Secondary | ICD-10-CM | POA: Diagnosis not present

## 2023-05-17 DIAGNOSIS — M25562 Pain in left knee: Secondary | ICD-10-CM | POA: Diagnosis not present

## 2023-07-03 DIAGNOSIS — M25562 Pain in left knee: Secondary | ICD-10-CM | POA: Diagnosis not present

## 2023-11-08 DIAGNOSIS — H26493 Other secondary cataract, bilateral: Secondary | ICD-10-CM | POA: Diagnosis not present

## 2023-11-08 DIAGNOSIS — Z961 Presence of intraocular lens: Secondary | ICD-10-CM | POA: Diagnosis not present

## 2023-11-08 DIAGNOSIS — H43813 Vitreous degeneration, bilateral: Secondary | ICD-10-CM | POA: Diagnosis not present

## 2023-11-08 DIAGNOSIS — H401131 Primary open-angle glaucoma, bilateral, mild stage: Secondary | ICD-10-CM | POA: Diagnosis not present

## 2023-11-21 ENCOUNTER — Emergency Department

## 2023-11-21 ENCOUNTER — Other Ambulatory Visit: Payer: Self-pay

## 2023-11-21 ENCOUNTER — Ambulatory Visit: Admission: EM | Admit: 2023-11-21 | Discharge: 2023-11-21 | Disposition: A | Discharge status: Home / Self Care

## 2023-11-21 ENCOUNTER — Emergency Department
Admission: EM | Admit: 2023-11-21 | Discharge: 2023-11-21 | Disposition: A | Discharge status: Home / Self Care | Attending: Emergency Medicine | Admitting: Emergency Medicine

## 2023-11-21 DIAGNOSIS — L089 Local infection of the skin and subcutaneous tissue, unspecified: Secondary | ICD-10-CM

## 2023-11-21 DIAGNOSIS — R600 Localized edema: Secondary | ICD-10-CM | POA: Diagnosis not present

## 2023-11-21 DIAGNOSIS — T148XXA Other injury of unspecified body region, initial encounter: Secondary | ICD-10-CM

## 2023-11-21 DIAGNOSIS — S81802A Unspecified open wound, left lower leg, initial encounter: Secondary | ICD-10-CM | POA: Diagnosis not present

## 2023-11-21 DIAGNOSIS — L03116 Cellulitis of left lower limb: Secondary | ICD-10-CM | POA: Insufficient documentation

## 2023-11-21 DIAGNOSIS — M7989 Other specified soft tissue disorders: Secondary | ICD-10-CM | POA: Diagnosis present

## 2023-11-21 LAB — BASIC METABOLIC PANEL WITH GFR
Anion gap: 7 (ref 5–15)
BUN: 15 mg/dL (ref 8–23)
CO2: 29 mmol/L (ref 22–32)
Calcium: 9.3 mg/dL (ref 8.9–10.3)
Chloride: 105 mmol/L (ref 98–111)
Creatinine, Ser: 1.04 mg/dL (ref 0.61–1.24)
GFR, Estimated: >60 mL/min (ref >60)
Glucose, Bld: 127 mg/dL — ABNORMAL HIGH (ref 70–99)
Potassium: 4.8 mmol/L (ref 3.5–5.1)
Sodium: 141 mmol/L (ref 135–145)

## 2023-11-21 LAB — CBC
HCT: 46 % (ref 39.0–52.0)
Hemoglobin: 15.1 g/dL (ref 13.0–17.0)
MCH: 31.5 pg (ref 26.0–34.0)
MCHC: 32.8 g/dL (ref 30.0–36.0)
MCV: 96 fL (ref 80.0–100.0)
Platelets: 209 K/uL (ref 150–400)
RBC: 4.79 MIL/uL (ref 4.22–5.81)
RDW: 12.2 % (ref 11.5–15.5)
WBC: 7.4 K/uL (ref 4.0–10.5)
nRBC: 0 % (ref 0.0–0.2)

## 2023-11-21 MED ORDER — CEFAZOLIN SODIUM-DEXTROSE 1-4 GM/50ML-% IV SOLN
1.0000 g | Freq: Once | INTRAVENOUS | Status: AC
  Administered 2023-11-21: 1 g via INTRAVENOUS
  Filled 2023-11-21: qty 50

## 2023-11-21 MED ORDER — CEPHALEXIN 500 MG PO CAPS: 500.0000 mg | ORAL_CAPSULE | Freq: Four times a day (QID) | ORAL | 0 refills | Status: AC

## 2023-11-21 MED ORDER — OXYCODONE-ACETAMINOPHEN 5-325 MG PO TABS
1.0000 | ORAL_TABLET | Freq: Once | ORAL | Status: AC
  Administered 2023-11-21: 1 via ORAL
  Filled 2023-11-21: qty 1

## 2023-11-21 NOTE — ED Triage Notes (Signed)
 Pt sent from urgent care. He had a sheet of metal fall on his left lower leg. Area has developed blisters and redness with drainage. Went to urgent care and they sent him here.

## 2023-11-21 NOTE — ED Triage Notes (Signed)
 Patient to Urgent Care with complaints of a wound infection present to his left shin. Reports a large sheet on metal fell and scraped his leg. No fevers.   Symptoms x8 days. Symptoms worse over the last 2 days with purulent drainage/ redness/ swelling.

## 2023-11-21 NOTE — Discharge Instructions (Signed)
 Go to the emergency department for evaluation of the infected wound and cellulitis on your left lower leg.

## 2023-11-21 NOTE — Discharge Instructions (Addendum)
 Take the antibiotic as prescribed and finish the full course.  Follow-up with your primary care provider.  In the meantime, return to the ER immediately for new, worsening, or persistent severe swelling, pain, increasing size of the redness, redness spreading up the leg, fever or chills, pain in the joints, or any other new or worsening symptoms that concern you.

## 2023-11-21 NOTE — ED Notes (Signed)
Patient given crackers and ice water.

## 2023-11-21 NOTE — ED Provider Notes (Signed)
 [name]    CSN: [id number] Arrival date & time: 11/21/23  1607      History   Chief Complaint Chief Complaint  Patient presents with   Wound Check    HPI Craig Landry is a 73 y.o. male.  Patient presents with redness, purulent drainage, and swelling of his left lower leg at the site of a wound that occurred 8 days ago, worse x 2 days.  The wound occurred when he accidentally scraped his leg with a large sheet of metal.  Approximately 2 days ago, he noticed acute increase in redness, swelling, and purulent drainage.  The skin at his lower shin has blisters.  He states the area is not painful.  No fever or chills.  He has been treating it with bandages.  The history is provided by the patient, the spouse and medical records.    Past Medical History:  Diagnosis Date   GERD (gastroesophageal reflux disease)    Gilbert's syndrome    Glaucoma    History of back injury 2004   work related injury ,  fell out of a truck   Neuropathy    left leg S/P back injury 2004    Patient Active Problem List   Diagnosis Date Noted   Elevated LFTs 08/18/2016   Sinusitis 08/16/2016   Elevated blood pressure reading 12/25/2013   Tick bite of left lower leg 05/30/2013   Routine general medical examination at a health care facility 01/22/2012   Screening for colon cancer 01/22/2012   Vision changes 05/24/2011   Screening for lipoid disorders 05/24/2011   Glaucoma    History of back injury     Past Surgical History:  Procedure Laterality Date   CATARACT EXTRACTION W/PHACO Right 11/29/2021   Procedure: CATARACT EXTRACTION PHACO AND INTRAOCULAR LENS PLACEMENT (IOC) RIGHT;  Surgeon: Myrna Adine Anes, MD;  Location: [facility];  Service: Ophthalmology;  Laterality: Right;  3.00 0:24.3   CATARACT EXTRACTION W/PHACO Left 12/13/2021   Procedure: CATARACT EXTRACTION PHACO AND INTRAOCULAR LENS PLACEMENT (IOC) LEFT  4.34  00:32.0;  Surgeon: Myrna Adine Anes, MD;   Location: [facility];  Service: Ophthalmology;  Laterality: Left;   ESOPHAGOGASTRODUODENOSCOPY         Home Medications    Prior to Admission medications   Medication Sig Start Date End Date Taking? Authorizing Provider  latanoprost (XALATAN) 0.005 % ophthalmic solution Place 1 drop into both eyes at bedtime.  11/12/13   [provider]  omeprazole (PRILOSEC) 20 MG capsule Take 20 mg by mouth daily.    [provider]  SUPER B COMPLEX/C PO Take by mouth daily.    [provider]    Family History Family History  Problem Relation Age of Onset   Heart disease Mother    Diabetes Mother    Vision loss Father 44       temporal arteritis    Social History Social History   Tobacco Use   Smoking status: Never   Smokeless tobacco: Never  Vaping Use   Vaping status: Never Used  Substance Use Topics   Alcohol use: No   Drug use: No     Allergies   Aleve [naproxen] and Latex   Review of Systems Review of Systems  Constitutional:  Negative for chills and fever.  Musculoskeletal:  Positive for joint swelling. Negative for arthralgias and gait problem.  Skin:  Positive for color change and wound.  Neurological:  Negative for weakness and numbness.  Physical Exam Triage Vital Signs ED Triage Vitals  Encounter Vitals Group     BP      Girls Systolic BP Percentile      Girls Diastolic BP Percentile      Boys Systolic BP Percentile      Boys Diastolic BP Percentile      Pulse      Resp      Temp      Temp src      SpO2      Weight      Height      Head Circumference      Peak Flow      Pain Score      Pain Loc      Pain Education      Exclude from Growth Chart    No data found.  Updated Vital Signs BP (!) 147/85   Pulse 71   Temp 98 F (36.7 C)   Resp 18   SpO2 98%   Visual Acuity Right Eye Distance:   Left Eye Distance:   Bilateral Distance:    Right Eye Near:   Left Eye Near:    Bilateral Near:      Physical Exam Constitutional:      General: He is not in acute distress. HENT:     Mouth/Throat:     Mouth: Mucous membranes are moist.  Cardiovascular:     Rate and Rhythm: Normal rate.  Pulmonary:     Effort: Pulmonary effort is normal. No respiratory distress.  Musculoskeletal:        General: Swelling present. No tenderness or deformity. Normal range of motion.  Skin:    General: Skin is warm and dry.     Capillary Refill: Capillary refill takes less than 2 seconds.     Findings: Erythema and lesion present.     Comments: Abrasions on left shin with erythema and edema from upper shin to foot.  See picture for details.  Neurological:     General: No focal deficit present.     Mental Status: He is alert.     Sensory: No sensory deficit.     Motor: No weakness.     Gait: Gait normal.      UC Treatments / Results  Labs (all labs ordered are listed, but only abnormal results are displayed) Labs Reviewed - No data to display  EKG   Radiology No results found.  Procedures Procedures (including critical care time)  Medications Ordered in UC Medications - No data to display  Initial Impression / Assessment and Plan / UC Course  I have reviewed the triage vital signs and the nursing notes.  Pertinent labs & imaging results that were available during my care of the patient were reviewed by me and considered in my medical decision making (see chart for details).    Cellulitis and infected wound of left lower leg.  Afebrile and vital signs are stable.  Patient has infected wound on his left shin which has become acutely worse in the last 2 days with erythema and edema from shin to foot.  The wound have purulent drainage.  Sending patient to the ED for evaluation.  He is agreeable to this.  He is accompanied by his wife.  They will go to [facility] ED now.  Final Clinical Impressions(s) / UC Diagnoses   Final diagnoses:  Cellulitis of left lower leg  Infected wound      Discharge Instructions  Go to the emergency department for evaluation of the infected wound and cellulitis on your left lower leg.     ED Prescriptions   None    PDMP not reviewed this encounter.   Corlis Burnard DEL, NP 11/21/23 [id number]

## 2023-11-21 NOTE — ED Provider Notes (Signed)
 [facility] Provider Note    Event Date/Time   First MD Initiated Contact with Patient 11/21/23 2129     (approximate)   History   Wound Infection   HPI  Craig Landry is a 73 y.o. male with a history of GERD, Bertrum syndrome, and neuropathy who presents with left foot and leg redness and swelling over the last 2 days.  The patient states that a heavy metal tool that he was working with fell onto his left shin about 8 days ago.  There was a wound which seemed to be scabbing and healing relatively well.  However the last 2 days he started develop redness to the distal lower leg below \\and  around the wound as well as to the foot, with increasing swelling and a few blisters.  He reports moderate pain.  He denies any pain or swelling proximal to the upper shin.  He has no pain in the knee.  He denies any fever or chills.  I reviewed the past medical records.  The patient was seen at urgent care earlier today with the same symptoms.  He was diagnosed with likely cellulitis and recommended to go to the hospital for evaluation.   Physical Exam   Triage Vital Signs: ED Triage Vitals  Encounter Vitals Group     BP 11/21/23 1722 (!) 165/90     Girls Systolic BP Percentile --      Girls Diastolic BP Percentile --      Boys Systolic BP Percentile --      Boys Diastolic BP Percentile --      Pulse Rate 11/21/23 1722 61     Resp 11/21/23 1722 17     Temp 11/21/23 1722 97.7 F (36.5 C)     Temp Source 11/21/23 1722 Oral     SpO2 11/21/23 1722 99 %     Weight --      Height --      Head Circumference --      Peak Flow --      Pain Score 11/21/23 1720 3     Pain Loc --      Pain Education --      Exclude from Growth Chart --     Most recent vital signs: Vitals:   11/21/23 1722 11/21/23 2136  BP: (!) 165/90 (!) 182/95  Pulse: 61 65  Resp: 17 16  Temp: 97.7 F (36.5 C) 98.2 F (36.8 C)  SpO2: 99% 98%     General: Awake, no distress.  CV:  Good  peripheral perfusion.  Resp:  Normal effort.  Abd:  No distention.  Other:  Left foot   ED Results / Procedures / Treatments   Labs (all labs ordered are listed, but only abnormal results are displayed) Labs Reviewed  BASIC METABOLIC PANEL WITH GFR - Abnormal; Notable for the following components:      Result Value   Glucose, Bld 127 (*)    All other components within normal limits  CBC     EKG    RADIOLOGY  XR L tibia/fibula: I independently viewed and interpreted the images; there are no acute bony abnormalities.  PROCEDURES:  Critical Care performed: No  Procedures   MEDICATIONS ORDERED IN ED: Medications  oxyCODONE-acetaminophen (PERCOCET/ROXICET) 5-325 MG per tablet 1 tablet (1 tablet Oral Given 11/21/23 2243)  ceFAZolin (ANCEF) IVPB 1 g/50 mL premix (0 g Intravenous Stopped 11/21/23 2256)     IMPRESSION / MDM / ASSESSMENT AND PLAN /  ED COURSE  I reviewed the triage vital signs and the nursing notes.  73 year old male with PMH as noted above presents with redness and swelling to the left foot and distal left lower leg after sustaining a wound to the middle of the shin 8 days ago.  On exam the patient is overall well-appearing.  His vital signs are normal except for hypertension.  He has no systemic symptoms.  There is no redness or swelling proximal to the lower leg.  Differential diagnosis includes, but is not limited to, cellulitis.  There are no fluctuant masses or evidence of abscess.  There is no purulent drainage from the wound.  There is no clinical evidence of DVT.  There is no evidence of myositis, necrotizing fasciitis, or other deep soft tissue infection.  There are no systemic symptoms.  BMP and CBC show no acute findings.  We will obtain an x-ray, give a dose of IV Ancef, and reassess.  Patient's presentation is most consistent with acute complicated illness / injury requiring diagnostic workup.  The patient is on the cardiac monitor to evaluate for  evidence of arrhythmia and/or significant heart rate changes.  ----------------------------------------- 11:18 PM on 11/21/2023 -----------------------------------------  X-rays show no acute findings.  The patient has received IV Ancef.  I did consider whether he may benefit from inpatient admission given his age as well as the relatively large size of the cellulitis.  I offered this to the patient.  However, he would strongly prefer to go home.  Given that he is having no pain, streaking, redness going proximally up the leg, no fever or systemic symptoms, and normal labs, I think that this is reasonable.  I advised he may return at any time if he changes his mind.  I also gave him strict return precautions and he expressed understanding.  I have prescribed Keflex.  There is no evidence of MRSA and he is not diabetic.     FINAL CLINICAL IMPRESSION(S) / ED DIAGNOSES   Final diagnoses:  Left leg cellulitis     Rx / DC Orders   ED Discharge Orders          Ordered    cephALEXin (KEFLEX) 500 MG capsule  4 times daily        11/21/23 2318             Note:  This document was prepared using Dragon voice recognition software and may include unintentional dictation errors.    Jacolyn Pae, MD 11/21/23 2320
